# Patient Record
Sex: Female | Born: 1950
Health system: Southern US, Community
[De-identification: ages and names within clinical notes are randomized; demographics above are authoritative.]

## PROBLEM LIST (undated history)

## (undated) DIAGNOSIS — Z8719 Personal history of other diseases of the digestive system: Secondary | ICD-10-CM

## (undated) DIAGNOSIS — K219 Gastro-esophageal reflux disease without esophagitis: Secondary | ICD-10-CM

## (undated) DIAGNOSIS — Z9889 Other specified postprocedural states: Secondary | ICD-10-CM

## (undated) DIAGNOSIS — Z8489 Family history of other specified conditions: Secondary | ICD-10-CM

## (undated) DIAGNOSIS — M75101 Unspecified rotator cuff tear or rupture of right shoulder, not specified as traumatic: Secondary | ICD-10-CM

## (undated) DIAGNOSIS — I10 Essential (primary) hypertension: Secondary | ICD-10-CM

## (undated) DIAGNOSIS — R112 Nausea with vomiting, unspecified: Secondary | ICD-10-CM

## (undated) DIAGNOSIS — T7840XA Allergy, unspecified, initial encounter: Secondary | ICD-10-CM

## (undated) DIAGNOSIS — E119 Type 2 diabetes mellitus without complications: Secondary | ICD-10-CM

## (undated) DIAGNOSIS — E785 Hyperlipidemia, unspecified: Secondary | ICD-10-CM

## (undated) HISTORY — PX: ELBOW SURGERY: SHX618

## (undated) HISTORY — PX: KNEE SURGERY: SHX244

## (undated) HISTORY — DX: Type 2 diabetes mellitus without complications: E11.9

## (undated) HISTORY — DX: Allergy, unspecified, initial encounter: T78.40XA

## (undated) HISTORY — DX: Essential (primary) hypertension: I10

## (undated) HISTORY — DX: Gastro-esophageal reflux disease without esophagitis: K21.9

## (undated) HISTORY — PX: TUBAL LIGATION: SHX77

## (undated) HISTORY — DX: Hyperlipidemia, unspecified: E78.5

---
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
## 1998-08-15 ENCOUNTER — Other Ambulatory Visit: Admission: RE | Admit: 1998-08-15 | Discharge: 1998-08-15 | Payer: Self-pay | Admitting: Obstetrics and Gynecology

## 1999-09-10 ENCOUNTER — Other Ambulatory Visit: Admission: RE | Admit: 1999-09-10 | Discharge: 1999-09-10 | Payer: Self-pay | Admitting: Obstetrics and Gynecology

## 2000-09-22 ENCOUNTER — Other Ambulatory Visit: Admission: RE | Admit: 2000-09-22 | Discharge: 2000-09-22 | Payer: Self-pay | Admitting: Obstetrics and Gynecology

## 2001-10-13 ENCOUNTER — Other Ambulatory Visit: Admission: RE | Admit: 2001-10-13 | Discharge: 2001-10-13 | Payer: Self-pay | Admitting: Obstetrics and Gynecology

## 2002-10-19 ENCOUNTER — Other Ambulatory Visit: Admission: RE | Admit: 2002-10-19 | Discharge: 2002-10-19 | Payer: Self-pay | Admitting: Obstetrics and Gynecology

## 2002-10-19 ENCOUNTER — Encounter: Payer: Self-pay | Admitting: Obstetrics and Gynecology

## 2002-10-19 ENCOUNTER — Encounter: Admission: RE | Admit: 2002-10-19 | Discharge: 2002-10-19 | Payer: Self-pay | Admitting: Obstetrics and Gynecology

## 2003-11-22 ENCOUNTER — Other Ambulatory Visit: Admission: RE | Admit: 2003-11-22 | Discharge: 2003-11-22 | Payer: Self-pay | Admitting: Obstetrics and Gynecology

## 2004-12-09 ENCOUNTER — Other Ambulatory Visit: Admission: RE | Admit: 2004-12-09 | Discharge: 2004-12-09 | Payer: Self-pay | Admitting: Obstetrics and Gynecology

## 2006-01-15 ENCOUNTER — Ambulatory Visit: Payer: Self-pay | Admitting: Gastroenterology

## 2008-06-12 ENCOUNTER — Ambulatory Visit: Payer: Self-pay | Admitting: Family Medicine

## 2009-08-14 ENCOUNTER — Ambulatory Visit: Payer: Self-pay | Admitting: Family Medicine

## 2010-06-05 ENCOUNTER — Ambulatory Visit: Payer: Self-pay

## 2010-06-18 ENCOUNTER — Ambulatory Visit: Payer: Self-pay | Admitting: Orthopedic Surgery

## 2010-10-17 ENCOUNTER — Ambulatory Visit: Payer: Self-pay | Admitting: Family Medicine

## 2011-04-07 ENCOUNTER — Ambulatory Visit: Payer: Self-pay | Admitting: Unknown Physician Specialty

## 2011-05-05 ENCOUNTER — Ambulatory Visit: Payer: Self-pay | Admitting: Unknown Physician Specialty

## 2011-05-05 HISTORY — PX: COLONOSCOPY WITH ESOPHAGOGASTRODUODENOSCOPY (EGD): SHX5779

## 2011-05-07 LAB — PATHOLOGY REPORT

## 2011-11-12 ENCOUNTER — Ambulatory Visit: Payer: Self-pay | Admitting: Family Medicine

## 2012-12-02 ENCOUNTER — Ambulatory Visit: Payer: Self-pay | Admitting: Family Medicine

## 2013-11-13 HISTORY — PX: ANAL FISSURE REPAIR: SHX2312

## 2013-11-16 ENCOUNTER — Ambulatory Visit: Payer: Self-pay | Admitting: Surgery

## 2013-12-15 ENCOUNTER — Ambulatory Visit: Payer: Self-pay | Admitting: Family Medicine

## 2014-07-15 NOTE — Op Note (Signed)
PATIENT NAME:  Laurie Watson, CORTI MR#:  [id number] DATE OF BIRTH:  05/24/1950  DATE OF PROCEDURE:  11/16/2013  PREOPERATIVE DIAGNOSIS: Anal fissure.   POSTOPERATIVE DIAGNOSIS: Anal fissure.  OPERATION: Rectal exam under anesthesia with closed lateral sphincterotomy.   ANESTHESIA: General.   SURGEON: Rodena Goldmann, MD  OPERATIVE PROCEDURE: With the patient in the supine position, after the induction of appropriate general anesthesia, the patient was placed in lithotomy position, appropriately padded and positioned. Her rectum was digitally manipulated. It was very tight. There was some induration on the anterior surface in lithotomy. Careful inspection of the rectum revealed a fissure at the 12 o'clock position in lithotomy. It was debrided sharply. Lateral closed sphincterotomy was performed by inserting an 11 blade into the groove between the sphincters turning the blade 90 degrees and withdrawing it once. I broke up the remainder of the muscle fibers with index finger. I was unable to dilate the rectum to 2+ fingerbreadths without difficulty. The area was examined. No other abnormalities were identified. There was a small hemorrhoid in the anterior position. Exparel 20 mg was injected for postoperative pain control. Avitene and Gelfoam was inserted into the rectum and a sterile dressing applied. The patient was returned to the recovery room, having tolerated the procedure well. Sponge, instrument and needle counts were correct x 2 in the Operating Room.    ____________________________ Rodena Goldmann III, MD rle:TT D: 11/16/2013 14:02:34 ET T: 11/16/2013 23:08:09 ET JOB#: [id number]  cc: Micheline Maze, MD, <Dictator> Jerrell Belfast, MD Rodena Goldmann MD ELECTRONICALLY SIGNED 11/17/2013 17:28

## 2014-09-04 ENCOUNTER — Other Ambulatory Visit: Payer: Self-pay | Admitting: Family Medicine

## 2014-09-04 DIAGNOSIS — E7800 Pure hypercholesterolemia, unspecified: Secondary | ICD-10-CM

## 2014-09-04 DIAGNOSIS — K219 Gastro-esophageal reflux disease without esophagitis: Secondary | ICD-10-CM

## 2014-09-04 DIAGNOSIS — E119 Type 2 diabetes mellitus without complications: Secondary | ICD-10-CM

## 2014-09-04 MED ORDER — METFORMIN HCL 500 MG PO TABS: 500.0000 mg | ORAL_TABLET | Freq: Two times a day (BID) | ORAL | Status: DC

## 2014-09-04 MED ORDER — SIMVASTATIN 20 MG PO TABS: 20.0000 mg | ORAL_TABLET | Freq: Every day | ORAL | Status: DC

## 2014-09-04 MED ORDER — OMEPRAZOLE 20 MG PO CPDR: 20.0000 mg | DELAYED_RELEASE_CAPSULE | Freq: Every day | ORAL | Status: DC

## 2014-09-04 NOTE — Telephone Encounter (Signed)
Pt contacted office for refill request on the following medications: Metformin HCI 500 mg Omeprazole 20 mg Simvastatin 20 mg Pt stated that her insurance just notified her that she needs to have her RX filled at [facility] and she would like to use [employer]. Thanks TNP

## 2014-09-05 ENCOUNTER — Ambulatory Visit (INDEPENDENT_AMBULATORY_CARE_PROVIDER_SITE_OTHER): Payer: 59 | Admitting: Family Medicine

## 2014-09-05 ENCOUNTER — Encounter: Payer: Self-pay | Admitting: Family Medicine

## 2014-09-05 VITALS — BP 122/78 | HR 68 | Temp 98.0°F | Resp 16 | Wt 168.4 lb

## 2014-09-05 DIAGNOSIS — E7800 Pure hypercholesterolemia, unspecified: Secondary | ICD-10-CM | POA: Insufficient documentation

## 2014-09-05 DIAGNOSIS — R7989 Other specified abnormal findings of blood chemistry: Secondary | ICD-10-CM | POA: Insufficient documentation

## 2014-09-05 DIAGNOSIS — E1169 Type 2 diabetes mellitus with other specified complication: Secondary | ICD-10-CM | POA: Insufficient documentation

## 2014-09-05 DIAGNOSIS — K602 Anal fissure, unspecified: Secondary | ICD-10-CM | POA: Insufficient documentation

## 2014-09-05 DIAGNOSIS — H6692 Otitis media, unspecified, left ear: Secondary | ICD-10-CM

## 2014-09-05 DIAGNOSIS — H6592 Unspecified nonsuppurative otitis media, left ear: Secondary | ICD-10-CM

## 2014-09-05 DIAGNOSIS — K219 Gastro-esophageal reflux disease without esophagitis: Secondary | ICD-10-CM | POA: Insufficient documentation

## 2014-09-05 DIAGNOSIS — J0100 Acute maxillary sinusitis, unspecified: Secondary | ICD-10-CM

## 2014-09-05 DIAGNOSIS — E119 Type 2 diabetes mellitus without complications: Secondary | ICD-10-CM | POA: Insufficient documentation

## 2014-09-05 DIAGNOSIS — E785 Hyperlipidemia, unspecified: Secondary | ICD-10-CM | POA: Insufficient documentation

## 2014-09-05 DIAGNOSIS — K649 Unspecified hemorrhoids: Secondary | ICD-10-CM | POA: Insufficient documentation

## 2014-09-05 DIAGNOSIS — R945 Abnormal results of liver function studies: Secondary | ICD-10-CM | POA: Insufficient documentation

## 2014-09-05 MED ORDER — FLUTICASONE PROPIONATE 50 MCG/ACT NA SUSP: 2.0000 | Freq: Every day | NASAL | Status: DC

## 2014-09-05 MED ORDER — AZITHROMYCIN 250 MG PO TABS: ORAL_TABLET | ORAL | Status: DC

## 2014-09-05 NOTE — Progress Notes (Signed)
Subjective:    Patient ID: Laurie Watson, female    DOB: 07/28/1950, 64 y.o.   MRN: [id number]  HPI Sinusitis and Ear Pain onset 3 weeks ago. Started with sore throat and cough with clear rhinorrhea and post nasal drip.Went on a cruise to the [place] and had to use a Netti-Pot to release some of the pressure. Flights to and from the cruise embarkation area cause ears to stop up and become painful. Hearing slightly diminished with some popping occasionally. No fever. Headache/pressure around maxillary sinus area. No sputum production. Used OTC ear drops, Tylenol Allergy/Sinus and left over Hydrocodone cough syrup to control symptoms.  Patient Active Problem List   Diagnosis Date Noted  . Anal fissure 09/05/2014  . Diabetes mellitus, type 2 09/05/2014  . Abnormal LFTs 09/05/2014  . Acid reflux 09/05/2014  . Hemorrhoid 09/05/2014  . Hypercholesteremia 09/05/2014   Past Surgical History  Procedure Laterality Date  . Anal fissure repair  11/13/2013  . Eye surgery Left 1972  . Tonsillectomy and adenoidectomy  1970  . Tubal ligation    . Elbow surgery Left   . Knee surgery Left    History  Substance Use Topics  . Smoking status: Never Smoker   . Smokeless tobacco: Not on file  . Alcohol Use: No   Current Outpatient Prescriptions on File Prior to Visit  Medication Sig Dispense Refill  . metFORMIN (GLUCOPHAGE) 500 MG tablet Take 1 tablet (500 mg total) by mouth 2 (two) times daily with a meal. 60 tablet 6  . omeprazole (PRILOSEC) 20 MG capsule Take 1 capsule (20 mg total) by mouth daily. 30 capsule 6  . simvastatin (ZOCOR) 20 MG tablet Take 1 tablet (20 mg total) by mouth daily. 30 tablet 6   No current facility-administered medications on file prior to visit.   Allergies  Allergen Reactions  . Penicillins Rash   Family History  Problem Relation Age of Onset  . Diabetes Mother   . Breast cancer Mother   . Bladder Cancer Mother   . Hypertension Father   . Diabetes Father    . CVA Father   . Diabetes Brother   . Hyperlipidemia Brother   . Heart attack Brother   . Diabetes Brother   . Diabetes Brother        Review of Systems  Constitutional: Negative.   HENT: Positive for ear pain, hearing loss, postnasal drip, sinus pressure and sneezing.   Eyes: Positive for itching.  Respiratory: Positive for cough. Negative for shortness of breath and wheezing.   Cardiovascular: Negative.   Gastrointestinal: Negative.   Genitourinary: Negative.        Objective:   Physical Exam  Constitutional: She is oriented to person, place, and time. She appears well-developed and well-nourished. No distress.  HENT:  Head: Normocephalic and atraumatic.  Right Ear: Hearing and external ear normal.  Left Ear: Tympanic membrane is retracted. A middle ear effusion is present. Decreased hearing is noted.  Nose: Right sinus exhibits maxillary sinus tenderness. Right sinus exhibits no frontal sinus tenderness. Left sinus exhibits maxillary sinus tenderness. Left sinus exhibits no frontal sinus tenderness.  Eyes: Conjunctivae and lids are normal. Right eye exhibits no discharge. Left eye exhibits no discharge. No scleral icterus.  Pulmonary/Chest: Effort normal. No respiratory distress.  Musculoskeletal: Normal range of motion.  Neurological: She is alert and oriented to person, place, and time.  Skin: Skin is intact. No lesion and no rash noted.  Psychiatric: She has a normal  mood and affect. Her speech is normal and behavior is normal. Thought content normal.   BP 122/78 mmHg  Pulse 68  Temp(Src) 98 F (36.7 C) (Oral)  Resp 16  Wt 168 lb 6.4 oz (76.386 kg)     Assessment & Plan:  1. Left otitis media with effusion Onset after onset of sinus congestion and during flight back from cruise to the [place]. Will treat with steroid nasal spray and antibiotic. May use Mucinex and Claritin for symptomatic relief. Recheck prn. - fluticasone (FLONASE) 50 MCG/ACT nasal spray;  Place 2 sprays into both nostrils daily.  Dispense: 16 g; Refill: 2 - azithromycin (ZITHROMAX Z-PAK) 250 MG tablet; Two tablets by mouth the first day then one daily for 4 days  Dispense: 6 each; Refill: 0  2. Subacute maxillary sinusitis Onset with some cough , sore throat and post nasal drip 3 weeks ago. Worsened during cruise. Some relief from use of Netti Pot. May continue Tylenol or Advil prn headache. Treat with steroid nasal spray and antibiotic. Recheck prn. - fluticasone (FLONASE) 50 MCG/ACT nasal spray; Place 2 sprays into both nostrils daily.  Dispense: 16 g; Refill: 2 - azithromycin (ZITHROMAX Z-PAK) 250 MG tablet; Two tablets by mouth the first day then one daily for 4 days  Dispense: 6 each; Refill: 0

## 2014-09-13 ENCOUNTER — Telehealth: Payer: Self-pay | Admitting: Family Medicine

## 2014-09-13 DIAGNOSIS — H9202 Otalgia, left ear: Secondary | ICD-10-CM

## 2014-09-13 NOTE — Telephone Encounter (Signed)
Pt stated she saw Simona Huh on 09/05/14 and finished the Z pack on Saturday 09/09/14. Pt stated she has had little improvement and would like to go ahead with the referral to ENT. Thanks TNP

## 2014-09-13 NOTE — Telephone Encounter (Signed)
Please advise if okay to proceed with ENT referral.

## 2014-09-14 NOTE — Telephone Encounter (Signed)
Will send order for referral.

## 2014-11-15 ENCOUNTER — Other Ambulatory Visit: Payer: Self-pay | Admitting: Family Medicine

## 2014-11-15 DIAGNOSIS — Z1231 Encounter for screening mammogram for malignant neoplasm of breast: Secondary | ICD-10-CM

## 2014-12-12 ENCOUNTER — Telehealth: Payer: Self-pay | Admitting: Family Medicine

## 2014-12-12 ENCOUNTER — Ambulatory Visit (INDEPENDENT_AMBULATORY_CARE_PROVIDER_SITE_OTHER): Payer: 59 | Admitting: Family Medicine

## 2014-12-12 ENCOUNTER — Encounter: Payer: Self-pay | Admitting: Family Medicine

## 2014-12-12 VITALS — BP 130/84 | HR 72 | Temp 98.2°F | Resp 16 | Wt 167.0 lb

## 2014-12-12 DIAGNOSIS — N644 Mastodynia: Secondary | ICD-10-CM

## 2014-12-12 NOTE — Telephone Encounter (Signed)
Pt called saying she called [facility] to try to schedule her diagnostic mammogram and they told her that the order sent over was not complete and that she could not schedule it until it was.  They told her to call us and let us know that we needed to call them.  Her call back is [phone]   Please let her know when it is complete so she can call them back.  Thanks Con Memos

## 2014-12-12 NOTE — Telephone Encounter (Signed)
Per [facility] clock position for right breast pain needs to be noted in order unless pain is in entire breast.There also needs to be an order for left breast ultrasound entered in EPIC

## 2014-12-12 NOTE — Progress Notes (Signed)
Subjective:    Patient ID: Laurie Watson, female    DOB: 05/28/1950, 64 y.o.   MRN: [id number]  HPI  Breast Pain Pt c/o Right breast pain since the end of July. The onset has been gradual and has been in an intermittent pattern. The course is worsening. The pain is described as burning and throbbing, and radiates to the right shoulder. Pt describes the pain as mild, but is severe when exacerbated by coughing, sneezing, direct contact with the affected area, etc. Pt denies nipple discharge or redness. Pt reports she has noticed some swelling of the right axilla. When asked if pt noticed any mass, pt stated, "I feel some knots, but am unsure if that is due to scar tissue (pt has a h/o breast biopsy "many years ago"). There is a family history of breast cancer in the pt's mother. Pt states she believes the pain can be secondary to the biopsy she had several years ago or shingles (which pt has a H/O) vs cancer.  Review of Systems  Constitutional: Negative for fever, chills, diaphoresis, activity change, appetite change, fatigue and unexpected weight change.       Breast Pain is positive   BP 130/84 mmHg  Pulse 72  Temp(Src) 98.2 F (36.8 C) (Oral)  Resp 16  Wt 167 lb (75.751 kg)   Patient Active Problem List   Diagnosis Date Noted  . Anal fissure 09/05/2014  . Diabetes mellitus, type 2 09/05/2014  . Abnormal LFTs 09/05/2014  . Acid reflux 09/05/2014  . Hemorrhoid 09/05/2014  . Hypercholesteremia 09/05/2014   No past medical history on file. Current Outpatient Prescriptions on File Prior to Visit  Medication Sig  . aspirin 81 MG chewable tablet Chew 1 tablet by mouth daily.  . cholecalciferol (VITAMIN D) 1000 UNITS tablet Take 1 tablet by mouth daily.  Marland Kitchen docusate sodium (COLACE) 100 MG capsule Take 1 capsule by mouth daily.  . fluticasone (FLONASE) 50 MCG/ACT nasal spray Place 2 sprays into both nostrils daily.  Marland Kitchen glucose blood test strip   . metFORMIN (GLUCOPHAGE) 500 MG tablet  Take 1 tablet (500 mg total) by mouth 2 (two) times daily with a meal.  . Omega-3 Fatty Acids (FISH OIL BURP-LESS) 1000 MG CAPS Take 1 capsule by mouth daily.  Marland Kitchen omeprazole (PRILOSEC) 20 MG capsule Take 1 capsule (20 mg total) by mouth daily.  . Probiotic CAPS Take 1 capsule by mouth daily.  . simvastatin (ZOCOR) 20 MG tablet Take 1 tablet (20 mg total) by mouth daily.   No current facility-administered medications on file prior to visit.   Allergies  Allergen Reactions  . Penicillins Rash   Past Surgical History  Procedure Laterality Date  . Anal fissure repair  11/13/2013  . Eye surgery Left 1972  . Tonsillectomy and adenoidectomy  1970  . Tubal ligation    . Elbow surgery Left   . Knee surgery Left    Social History   Social History  . Marital Status: Married    Spouse Name: N/A  . Number of Children: N/A  . Years of Education: N/A   Occupational History  . Not on file.   Social History Main Topics  . Smoking status: Never Smoker   . Smokeless tobacco: Never Used  . Alcohol Use: 0.0 oz/week    0 Standard drinks or equivalent per week     Comment: occasional  . Drug Use: No  . Sexual Activity: Not on file   Other Topics Concern  .  Not on file   Social History Narrative   Family History  Problem Relation Age of Onset  . Diabetes Mother   . Breast cancer Mother   . Bladder Cancer Mother   . Hypertension Father   . Diabetes Father   . CVA Father   . Diabetes Brother   . Hyperlipidemia Brother   . Heart attack Brother   . Diabetes Brother   . Diabetes Brother      .result     Objective:   Physical Exam  Constitutional: She appears well-developed and well-nourished.  Cardiovascular: Normal rate and regular rhythm.   Pulmonary/Chest: Effort normal and breath sounds normal. Right breast exhibits tenderness (at 10:00). Right breast exhibits no inverted nipple, no mass and no nipple discharge. Left breast exhibits no inverted nipple, no mass, no nipple  discharge and no tenderness.    BP 130/84 mmHg  Pulse 72  Temp(Src) 98.2 F (36.8 C) (Oral)  Resp 16  Wt 167 lb (75.751 kg)     Assessment & Plan:  1. Breast pain, right Worsening. Patient really concerned about breast cancer. Will refer for diagnostic mammogram and ultrasound. Further plan pending these results.  - MM Digital Diagnostic Bilat; Future - US BREAST COMPLETE UNI RIGHT INC AXILLA; Future - US BREAST COMPLETE UNI LEFT INC AXILLA; Future  Margarita Rana, MD

## 2014-12-12 NOTE — Telephone Encounter (Signed)
Laurie Watson- Do you know how to fix this. Thanks.

## 2014-12-13 NOTE — Telephone Encounter (Signed)
Has been taken care of and patient notified. Thanks.

## 2014-12-13 NOTE — Telephone Encounter (Signed)
Tried documenting clock position in the left breast US order, Judson Roch will let me know if is not correct. Renaldo Fiddler, CMA

## 2014-12-20 ENCOUNTER — Other Ambulatory Visit: Payer: Self-pay | Admitting: Family Medicine

## 2014-12-20 ENCOUNTER — Ambulatory Visit
Admission: RE | Admit: 2014-12-20 | Discharge: 2014-12-20 | Disposition: A | Discharge status: Home / Self Care | Payer: 59 | Source: Ambulatory Visit | Attending: Family Medicine | Admitting: Family Medicine

## 2014-12-20 DIAGNOSIS — N644 Mastodynia: Secondary | ICD-10-CM

## 2015-02-06 ENCOUNTER — Ambulatory Visit: Payer: Self-pay

## 2015-03-05 ENCOUNTER — Encounter: Payer: Self-pay | Admitting: Family Medicine

## 2015-03-05 ENCOUNTER — Ambulatory Visit (INDEPENDENT_AMBULATORY_CARE_PROVIDER_SITE_OTHER): Payer: 59 | Admitting: Family Medicine

## 2015-03-05 ENCOUNTER — Other Ambulatory Visit: Payer: Self-pay | Admitting: Family Medicine

## 2015-03-05 VITALS — BP 120/72 | HR 81 | Temp 98.4°F | Resp 16 | Ht 63.0 in | Wt 167.0 lb

## 2015-03-05 DIAGNOSIS — E11319 Type 2 diabetes mellitus with unspecified diabetic retinopathy without macular edema: Secondary | ICD-10-CM | POA: Insufficient documentation

## 2015-03-05 DIAGNOSIS — Z23 Encounter for immunization: Secondary | ICD-10-CM

## 2015-03-05 DIAGNOSIS — R319 Hematuria, unspecified: Secondary | ICD-10-CM

## 2015-03-05 DIAGNOSIS — Z0000 Encounter for general adult medical examination without abnormal findings: Secondary | ICD-10-CM | POA: Diagnosis not present

## 2015-03-05 DIAGNOSIS — E119 Type 2 diabetes mellitus without complications: Secondary | ICD-10-CM | POA: Insufficient documentation

## 2015-03-05 DIAGNOSIS — E7800 Pure hypercholesterolemia, unspecified: Secondary | ICD-10-CM | POA: Diagnosis not present

## 2015-03-05 DIAGNOSIS — E1169 Type 2 diabetes mellitus with other specified complication: Secondary | ICD-10-CM | POA: Insufficient documentation

## 2015-03-05 LAB — POCT URINALYSIS DIPSTICK
Bilirubin, UA: NEGATIVE
Glucose, UA: NEGATIVE
Ketones, UA: NEGATIVE
Leukocytes, UA: NEGATIVE
Nitrite, UA: NEGATIVE
Protein, UA: NEGATIVE
Spec Grav, UA: >=1.03
Urobilinogen, UA: 0.2
pH, UA: 5

## 2015-03-05 NOTE — Progress Notes (Signed)
Patient ID: Laurie Watson, female   DOB: [birth year], 64 y.o.   MRN: [id number]        Patient: Laurie Watson, Female    DOB: 02-10-1951, 64 y.o.   MRN: [id number] Visit Date: 03/05/2015  Today's Provider: Margarita Rana, MD   Chief Complaint  Patient presents with  . Annual Exam   Subjective:    Annual physical exam Laurie Watson is a 64 y.o. female who presents today for health maintenance and complete physical. She feels well. She reports exercising staying active with daily activities. She reports she is sleeping well.  12/23/13 CPE 12/02/12 Pap-neg; HPV-neg 12/20/14 Mammo- BI-RADS 1 05/05/11 Colon-Diverticulosis, int hemorrhoids 10/17/10 EKG Results for orders placed or performed in visit on 03/05/15  POCT urinalysis dipstick  Result Value Ref Range   Color, UA yellow    Clarity, UA clear    Glucose, UA neg    Bilirubin, UA neg    Ketones, UA neg    Spec Grav, UA >=1.030    Blood, UA small    pH, UA 5.0    Protein, UA neg    Urobilinogen, UA 0.2    Nitrite, UA neg    Leukocytes, UA Negative Negative    -----------------------------------------------------------------   Review of Systems  Constitutional: Negative.   HENT: Negative.   Eyes: Negative.   Respiratory: Negative.   Cardiovascular: Negative.   Gastrointestinal: Negative.   Endocrine: Negative.   Genitourinary: Negative.   Musculoskeletal: Negative.   Skin: Negative.   Allergic/Immunologic: Negative.   Neurological: Negative.   Hematological: Negative.   Psychiatric/Behavioral: Negative.     Social History      She  reports that she has never smoked. She has never used smokeless tobacco. She reports that she drinks alcohol. She reports that she does not use illicit drugs.       Social History   Social History  . Marital Status: Married    Spouse Name: N/A  . Number of Children: N/A  . Years of Education: N/A   Social History Main Topics  . Smoking status: Never Smoker   .  Smokeless tobacco: Never Used  . Alcohol Use: 0.0 oz/week    0 Standard drinks or equivalent per week     Comment: occasional  . Drug Use: No  . Sexual Activity: Not Asked   Other Topics Concern  . None   Social History Narrative    Patient Active Problem List   Diagnosis Date Noted  . Diabetes mellitus without complication ([place]) [id number]  . Anal fissure 09/05/2014  . Diabetes mellitus, type 2 ([place]) 09/05/2014  . Abnormal LFTs 09/05/2014  . Acid reflux 09/05/2014  . Hemorrhoid 09/05/2014  . Hypercholesteremia 09/05/2014    Past Surgical History  Procedure Laterality Date  . Anal fissure repair  11/13/2013  . Eye surgery Left 1972  . Tonsillectomy and adenoidectomy  1970  . Tubal ligation    . Elbow surgery Left   . Knee surgery Left   . Breast biopsy Right 1990's    Core - neg    Family History        Family Status  Relation Status Death Age  . Mother Alive   . Father Alive   . Brother Alive   . Brother Alive   . Brother Alive         Her family history includes Bladder Cancer in her mother; Breast cancer in her mother; CVA in her father; Diabetes in her brother,  brother, brother, father, and mother; Heart attack in her brother; Hyperlipidemia in her brother; Hypertension in her father.    Allergies  Allergen Reactions  . Penicillins Rash    Previous Medications   ASPIRIN 81 MG CHEWABLE TABLET    Chew 1 tablet by mouth daily.   CHOLECALCIFEROL (VITAMIN D) 1000 UNITS TABLET    Take 1 tablet by mouth daily.   COENZYME Q10 (Q-10 CO-ENZYME PO)    Take by mouth.   DOCUSATE SODIUM (COLACE) 100 MG CAPSULE    Take 1 capsule by mouth daily.   GLUCOSE BLOOD TEST STRIP       METFORMIN (GLUCOPHAGE) 500 MG TABLET    Take 1 tablet (500 mg total) by mouth 2 (two) times daily with a meal.   OMEGA-3 FATTY ACIDS (FISH OIL BURP-LESS) 1000 MG CAPS    Take 1 capsule by mouth daily.   OMEPRAZOLE (PRILOSEC) 20 MG CAPSULE    Take 1 capsule (20 mg total) by mouth daily.    PROBIOTIC CAPS    Take 1 capsule by mouth daily.   SIMVASTATIN (ZOCOR) 20 MG TABLET    Take 1 tablet (20 mg total) by mouth daily.    Patient Care Team: Margarita Rana, MD as PCP - General (Family Medicine)     Objective:   Vitals: BP 120/72 mmHg  Pulse 81  Temp(Src) 98.4 F (36.9 C) (Oral)  Resp 16  Ht 5\' 3"  (1.6 m)  Wt 167 lb (75.751 kg)  BMI 29.59 kg/m2  SpO2 97%   Physical Exam  Constitutional: She is oriented to person, place, and time. She appears well-developed and well-nourished.  HENT:  Head: Normocephalic and atraumatic.  Right Ear: Tympanic membrane, external ear and ear canal normal.  Left Ear: Tympanic membrane, external ear and ear canal normal.  Nose: Nose normal.  Mouth/Throat: Uvula is midline, oropharynx is clear and moist and mucous membranes are normal.  Eyes: Conjunctivae, EOM and lids are normal. Pupils are equal, round, and reactive to light.  Neck: Trachea normal and normal range of motion. Neck supple. Carotid bruit is not present. No thyroid mass and no thyromegaly present.  Cardiovascular: Normal rate, regular rhythm and normal heart sounds.   Pulmonary/Chest: Effort normal and breath sounds normal.  Abdominal: Soft. Normal appearance and bowel sounds are normal. There is no hepatosplenomegaly. There is no tenderness.  Musculoskeletal: Normal range of motion.  Lymphadenopathy:    She has no cervical adenopathy.    She has no axillary adenopathy.  Neurological: She is alert and oriented to person, place, and time. She has normal strength. No cranial nerve deficit.  Skin: Skin is warm, dry and intact.  Psychiatric: She has a normal mood and affect. Her speech is normal and behavior is normal. Judgment and thought content normal. Cognition and memory are normal.     Depression Screen PHQ 2/9 Scores 03/05/2015  PHQ - 2 Score 0      Assessment & Plan:     Routine Health Maintenance and Physical Exam  Exercise Activities and Dietary  recommendations Goals    . Exercise 150 minutes per week (moderate activity)       Immunization History  Administered Date(s) Administered  . Influenza,inj,Quad PF,36+ Mos 03/05/2015  . Pneumococcal Polysaccharide-23 02/11/2012  . Td 04/23/1999  . Tdap 10/17/2010  . Zoster 04/05/2010      1. Annual physical exam Stable. Patient advised to continue eating healthy and exercise daily. - POCT urinalysis dipstick - CA 125  2.  Need for influenza vaccination - Flu Vaccine QUAD 36+ mos IM  3. Diabetes mellitus without complication ([place]) - Hemoglobin A1c  4. Hypercholesteremia - CBC with Differential/Platelet - Comprehensive metabolic panel - Lipid Panel With LDL/HDL Ratio - TSH     Patient seen and examined by Dr. Jerrell Belfast, and note scribed by Philbert Riser. Dim[place], CMA.  I have reviewed the document for accuracy and completeness and I agree with above. Jerrell Belfast, MD   Margarita Rana, MD   --------------------------------------------------------------------

## 2015-03-06 LAB — URINALYSIS, MICROSCOPIC ONLY: Casts: NONE SEEN /lpf

## 2015-03-07 ENCOUNTER — Telehealth: Payer: Self-pay

## 2015-03-07 LAB — COMPREHENSIVE METABOLIC PANEL
ALT: 33 IU/L — ABNORMAL HIGH (ref 0–32)
AST: 31 IU/L (ref 0–40)
Albumin/Globulin Ratio: 2.7 — ABNORMAL HIGH (ref 1.1–2.5)
Albumin: 4.8 g/dL (ref 3.6–4.8)
Alkaline Phosphatase: 84 IU/L (ref 39–117)
BUN/Creatinine Ratio: 22 (ref 11–26)
BUN: 15 mg/dL (ref 8–27)
Bilirubin Total: 0.6 mg/dL (ref 0.0–1.2)
CO2: 25 mmol/L (ref 18–29)
Calcium: 9.6 mg/dL (ref 8.7–10.3)
Chloride: 100 mmol/L (ref 96–106)
Creatinine, Ser: 0.69 mg/dL (ref 0.57–1.00)
GFR calc Af Amer: 108 mL/min/{1.73_m2} (ref >59)
GFR calc non Af Amer: 94 mL/min/{1.73_m2} (ref >59)
Globulin, Total: 1.8 g/dL (ref 1.5–4.5)
Glucose: 131 mg/dL — ABNORMAL HIGH (ref 65–99)
Potassium: 4.3 mmol/L (ref 3.5–5.2)
Sodium: 140 mmol/L (ref 134–144)
Total Protein: 6.6 g/dL (ref 6.0–8.5)

## 2015-03-07 LAB — CBC WITH DIFFERENTIAL/PLATELET
Basophils Absolute: 0 10*3/uL (ref 0.0–0.2)
Basos: 1 %
EOS (ABSOLUTE): 0.2 10*3/uL (ref 0.0–0.4)
Eos: 4 %
Hematocrit: 40.6 % (ref 34.0–46.6)
Hemoglobin: 13.6 g/dL (ref 11.1–15.9)
Immature Grans (Abs): 0 10*3/uL (ref 0.0–0.1)
Immature Granulocytes: 0 %
Lymphocytes Absolute: 2.3 10*3/uL (ref 0.7–3.1)
Lymphs: 34 %
MCH: 30.6 pg (ref 26.6–33.0)
MCHC: 33.5 g/dL (ref 31.5–35.7)
MCV: 91 fL (ref 79–97)
Monocytes Absolute: 0.3 10*3/uL (ref 0.1–0.9)
Monocytes: 5 %
Neutrophils Absolute: 3.8 10*3/uL (ref 1.4–7.0)
Neutrophils: 56 %
Platelets: 317 10*3/uL (ref 150–379)
RBC: 4.45 x10E6/uL (ref 3.77–5.28)
RDW: 13 % (ref 12.3–15.4)
WBC: 6.7 10*3/uL (ref 3.4–10.8)

## 2015-03-07 LAB — CA 125: CA 125: 8.6 U/mL (ref 0.0–38.1)

## 2015-03-07 LAB — LIPID PANEL WITH LDL/HDL RATIO
Cholesterol, Total: 171 mg/dL (ref 100–199)
HDL: 53 mg/dL (ref >39)
LDL Calculated: 93 mg/dL (ref 0–99)
LDl/HDL Ratio: 1.8 ratio units (ref 0.0–3.2)
Triglycerides: 125 mg/dL (ref 0–149)
VLDL Cholesterol Cal: 25 mg/dL (ref 5–40)

## 2015-03-07 LAB — HEMOGLOBIN A1C
Est. average glucose Bld gHb Est-mCnc: 143 mg/dL
Hgb A1c MFr Bld: 6.6 % — ABNORMAL HIGH (ref 4.8–5.6)

## 2015-03-07 LAB — TSH: TSH: 1.69 u[IU]/mL (ref 0.450–4.500)

## 2015-03-07 NOTE — Telephone Encounter (Signed)
Patient advised as directed below. Patient verbalized understanding.  

## 2015-03-07 NOTE — Telephone Encounter (Signed)
-----   Message from Margarita Rana, MD sent at 03/07/2015  9:26 AM EST ----- Labs stable except HgbA1c slightly higher than previous. Would recommend eating healthy and exercise and ov to recheck in 3 months. Thanks.

## 2015-03-13 ENCOUNTER — Encounter: Payer: Self-pay | Admitting: Family Medicine

## 2015-03-23 ENCOUNTER — Other Ambulatory Visit: Payer: Self-pay | Admitting: Family Medicine

## 2015-03-23 DIAGNOSIS — E7800 Pure hypercholesterolemia, unspecified: Secondary | ICD-10-CM

## 2015-03-23 NOTE — Telephone Encounter (Signed)
Last CPE 03/05/2015. Lipids were WNL. Renaldo Fiddler, CMA

## 2015-06-25 ENCOUNTER — Other Ambulatory Visit: Payer: Self-pay | Admitting: Family Medicine

## 2015-06-25 DIAGNOSIS — K219 Gastro-esophageal reflux disease without esophagitis: Secondary | ICD-10-CM

## 2015-10-17 ENCOUNTER — Other Ambulatory Visit: Payer: Self-pay | Admitting: Family Medicine

## 2015-10-17 DIAGNOSIS — E7800 Pure hypercholesterolemia, unspecified: Secondary | ICD-10-CM

## 2015-11-27 ENCOUNTER — Other Ambulatory Visit: Payer: Self-pay | Admitting: Family Medicine

## 2015-11-27 DIAGNOSIS — Z1231 Encounter for screening mammogram for malignant neoplasm of breast: Secondary | ICD-10-CM

## 2015-12-20 ENCOUNTER — Other Ambulatory Visit: Payer: Self-pay | Admitting: Family Medicine

## 2015-12-20 DIAGNOSIS — K219 Gastro-esophageal reflux disease without esophagitis: Secondary | ICD-10-CM

## 2016-02-05 ENCOUNTER — Ambulatory Visit
Admission: RE | Admit: 2016-02-05 | Discharge: 2016-02-05 | Disposition: A | Discharge status: Home / Self Care | Payer: BLUE CROSS/BLUE SHIELD | Source: Ambulatory Visit | Attending: Family Medicine | Admitting: Family Medicine

## 2016-02-05 DIAGNOSIS — Z1231 Encounter for screening mammogram for malignant neoplasm of breast: Secondary | ICD-10-CM

## 2016-02-06 ENCOUNTER — Telehealth: Payer: Self-pay

## 2016-02-06 NOTE — Telephone Encounter (Signed)
Patient has been advised. KW 

## 2016-02-06 NOTE — Telephone Encounter (Signed)
-----   Message from Mar Daring, PA-C sent at 02/06/2016  9:22 AM EST ----- Normal mammogram. Repeat screening in one year.

## 2016-03-05 ENCOUNTER — Ambulatory Visit (INDEPENDENT_AMBULATORY_CARE_PROVIDER_SITE_OTHER): Payer: BLUE CROSS/BLUE SHIELD | Admitting: Physician Assistant

## 2016-03-05 ENCOUNTER — Encounter: Payer: Self-pay | Admitting: Physician Assistant

## 2016-03-05 VITALS — BP 140/88 | HR 78 | Temp 98.4°F | Resp 16 | Wt 164.8 lb

## 2016-03-05 DIAGNOSIS — R059 Cough, unspecified: Secondary | ICD-10-CM

## 2016-03-05 DIAGNOSIS — Z1231 Encounter for screening mammogram for malignant neoplasm of breast: Secondary | ICD-10-CM | POA: Diagnosis not present

## 2016-03-05 DIAGNOSIS — K219 Gastro-esophageal reflux disease without esophagitis: Secondary | ICD-10-CM | POA: Diagnosis not present

## 2016-03-05 DIAGNOSIS — R05 Cough: Secondary | ICD-10-CM

## 2016-03-05 DIAGNOSIS — E119 Type 2 diabetes mellitus without complications: Secondary | ICD-10-CM | POA: Diagnosis not present

## 2016-03-05 DIAGNOSIS — Z1211 Encounter for screening for malignant neoplasm of colon: Secondary | ICD-10-CM | POA: Diagnosis not present

## 2016-03-05 DIAGNOSIS — Z1239 Encounter for other screening for malignant neoplasm of breast: Secondary | ICD-10-CM

## 2016-03-05 DIAGNOSIS — Z0000 Encounter for general adult medical examination without abnormal findings: Secondary | ICD-10-CM

## 2016-03-05 DIAGNOSIS — Z23 Encounter for immunization: Secondary | ICD-10-CM

## 2016-03-05 DIAGNOSIS — Z124 Encounter for screening for malignant neoplasm of cervix: Secondary | ICD-10-CM

## 2016-03-05 DIAGNOSIS — Z1159 Encounter for screening for other viral diseases: Secondary | ICD-10-CM

## 2016-03-05 DIAGNOSIS — E7800 Pure hypercholesterolemia, unspecified: Secondary | ICD-10-CM

## 2016-03-05 DIAGNOSIS — K602 Anal fissure, unspecified: Secondary | ICD-10-CM | POA: Diagnosis not present

## 2016-03-05 LAB — POCT UA - MICROALBUMIN: Microalbumin Ur, POC: 100 mg/L

## 2016-03-05 LAB — IFOBT (OCCULT BLOOD): IFOBT: NEGATIVE

## 2016-03-05 MED ORDER — METFORMIN HCL 500 MG PO TABS: 500.0000 mg | ORAL_TABLET | Freq: Two times a day (BID) | ORAL | 3 refills | Status: DC

## 2016-03-05 MED ORDER — ONETOUCH DELICA LANCETS 33G MISC: 5 refills | Status: DC

## 2016-03-05 MED ORDER — SIMVASTATIN 20 MG PO TABS: ORAL_TABLET | ORAL | 3 refills | Status: DC

## 2016-03-05 MED ORDER — OMEPRAZOLE 20 MG PO CPDR: DELAYED_RELEASE_CAPSULE | ORAL | 3 refills | Status: DC

## 2016-03-05 MED ORDER — GLUCOSE BLOOD VI STRP: ORAL_STRIP | 5 refills | Status: DC

## 2016-03-05 MED ORDER — LIDOCAINE HCL 3 % EX CREA: 1.0000 "application " | TOPICAL_CREAM | CUTANEOUS | 0 refills | Status: DC | PRN

## 2016-03-05 MED ORDER — PREDNISONE 10 MG (21) PO TBPK: ORAL_TABLET | ORAL | 0 refills | Status: DC

## 2016-03-05 NOTE — Patient Instructions (Signed)

## 2016-03-05 NOTE — Progress Notes (Signed)
Patient: Laurie Watson, Female    DOB: 01-Mar-1951, 65 y.o.   MRN: [id number] Visit Date: 03/05/2016  Today's Provider: Mar Daring, PA-C   Chief Complaint  Patient presents with  . Annual Exam   Subjective:    Annual physical exam Laurie Watson is a 65 y.o. female who presents today for health maintenance and complete physical. She feels well. She reports exercising. She reports she is sleeping well. She has been in a lot of stressed her husband was diagnosed with prostate cancer.  Last CPE: 03/05/15 Colon:05/05/2011- Diverticulosis-Int. Hemorrhoids Mammogram; 02/05/16 BI-RADS 1 -----------------------------------------------------------------   Review of Systems  Constitutional: Negative.   HENT: Positive for congestion.   Eyes: Negative.   Respiratory: Positive for cough.   Cardiovascular: Negative.   Gastrointestinal: Negative.   Endocrine: Negative.   Genitourinary: Negative.   Musculoskeletal: Negative.   Skin: Negative.   Allergic/Immunologic: Positive for environmental allergies.  Neurological: Negative.   Hematological: Negative.   Psychiatric/Behavioral: Negative.     Social History      She  reports that she has never smoked. She has never used smokeless tobacco. She reports that she drinks alcohol. She reports that she does not use drugs.       Social History   Social History  . Marital status: Married    Spouse name: N/A  . Number of children: N/A  . Years of education: N/A   Social History Main Topics  . Smoking status: Never Smoker  . Smokeless tobacco: Never Used  . Alcohol use 0.0 oz/week     Comment: occasional  . Drug use: No  . Sexual activity: Not Asked   Other Topics Concern  . None   Social History Narrative  . None    Past Medical History:  Diagnosis Date  . Diabetes mellitus without complication ([place])   . GERD (gastroesophageal reflux disease)   . Hyperlipidemia      Patient Active Problem List   Diagnosis Date Noted  . Diabetes mellitus without complication ([place]) [id number]  . Anal fissure 09/05/2014  . Diabetes mellitus, type 2 ([place]) 09/05/2014  . Abnormal LFTs 09/05/2014  . Acid reflux 09/05/2014  . Hemorrhoid 09/05/2014  . Hypercholesteremia 09/05/2014    Past Surgical History:  Procedure Laterality Date  . ANAL FISSURE REPAIR  11/13/2013  . BREAST BIOPSY Right 1990's   Core - neg  . ELBOW SURGERY Left   . EYE SURGERY Left 1972  . KNEE SURGERY Left   . TONSILLECTOMY AND ADENOIDECTOMY  1970  . TUBAL LIGATION      Family History        Family Status  Relation Status  . Mother Alive  . Father Alive  . Brother Alive  . Brother Alive  . Brother Alive        Her family history includes Bladder Cancer in her mother; Breast cancer in her mother; CVA in her father; Diabetes in her brother, brother, brother, father, and mother; Heart attack in her brother; Hyperlipidemia in her brother; Hypertension in her father.     Allergies  Allergen Reactions  . Codeine Nausea Only  . Penicillins Rash     Current Outpatient Prescriptions:  .  aspirin 81 MG chewable tablet, Chew 1 tablet by mouth daily., Disp: , Rfl:  .  cholecalciferol (VITAMIN D) 1000 UNITS tablet, Take 1 tablet by mouth daily., Disp: , Rfl:  .  Coenzyme Q10 (Q-10 CO-ENZYME PO), Take by mouth., Disp: ,  Rfl:  .  docusate sodium (COLACE) 100 MG capsule, Take 1 capsule by mouth daily., Disp: , Rfl:  .  glucose blood test strip, , Disp: , Rfl:  .  metFORMIN (GLUCOPHAGE) 500 MG tablet, Take 1 tablet (500 mg total) by mouth 2 (two) times daily with a meal., Disp: 60 tablet, Rfl: 6 .  Omega-3 Fatty Acids (FISH OIL BURP-LESS) 1000 MG CAPS, Take 1 capsule by mouth daily., Disp: , Rfl:  .  omeprazole (PRILOSEC) 20 MG capsule, TAKE 1 CAPSULE(20 MG) BY MOUTH DAILY, Disp: 30 capsule, Rfl: 5 .  Probiotic CAPS, Take 1 capsule by mouth daily., Disp: , Rfl:  .  simvastatin (ZOCOR) 20 MG tablet, TAKE 1 TABLET(20 MG) BY MOUTH  DAILY, Disp: 90 tablet, Rfl: 1 .  ketoconazole (NIZORAL) 2 % shampoo, , Disp: , Rfl: 10   Patient Care Team: Mar Daring, PA-C as PCP - General (Family Medicine)      Objective:   Vitals: BP 140/88 (BP Location: Right Arm, Patient Position: Sitting, Cuff Size: Normal) Comment: Pt checked this am and it was 122/84  Pulse 78   Temp 98.4 F (36.9 C) (Oral)   Resp 16   Wt 164 lb 12.8 oz (74.8 kg)   BMI 29.19 kg/m    Physical Exam  Constitutional: She is oriented to person, place, and time. She appears well-developed and well-nourished. No distress.  HENT:  Head: Normocephalic and atraumatic.  Right Ear: Hearing, tympanic membrane, external ear and ear canal normal.  Left Ear: Hearing, tympanic membrane, external ear and ear canal normal.  Nose: Nose normal.  Mouth/Throat: Uvula is midline, oropharynx is clear and moist and mucous membranes are normal. No oropharyngeal exudate.  Eyes: Conjunctivae and EOM are normal. Pupils are equal, round, and reactive to light. Right eye exhibits no discharge. Left eye exhibits no discharge. No scleral icterus.  Neck: Normal range of motion. Neck supple. No JVD present. Carotid bruit is not present. No tracheal deviation present. No thyromegaly present.  Cardiovascular: Normal rate, regular rhythm, normal heart sounds and intact distal pulses.  Exam reveals no gallop and no friction rub.   No murmur heard. Pulmonary/Chest: Effort normal and breath sounds normal. No respiratory distress. She has no wheezes. She has no rales. She exhibits no tenderness. Right breast exhibits no inverted nipple, no mass, no nipple discharge, no skin change and no tenderness. Left breast exhibits no inverted nipple, no mass, no nipple discharge, no skin change and no tenderness. Breasts are symmetrical.  Abdominal: Soft. Bowel sounds are normal. She exhibits no distension and no mass. There is no tenderness. There is no rebound and no guarding. Hernia confirmed  negative in the right inguinal area and confirmed negative in the left inguinal area.  Genitourinary: Rectum normal, vagina normal and uterus normal. Rectal exam shows guaiac negative stool. No breast swelling, tenderness, discharge or bleeding. Pelvic exam was performed with patient supine. There is no rash, tenderness, lesion or injury on the right labia. There is no rash, tenderness, lesion or injury on the left labia. Cervix exhibits no motion tenderness, no discharge and no friability. Right adnexum displays no mass, no tenderness and no fullness. Left adnexum displays no mass, no tenderness and no fullness. No erythema, tenderness or bleeding in the vagina. No signs of injury around the vagina. No vaginal discharge found.  Musculoskeletal: Normal range of motion. She exhibits no edema or tenderness.  Lymphadenopathy:    She has no cervical adenopathy.  Right: No inguinal adenopathy present.       Left: No inguinal adenopathy present.  Neurological: She is alert and oriented to person, place, and time. She has normal reflexes. No cranial nerve deficit. Coordination normal.  Skin: Skin is warm and dry. No rash noted. She is not diaphoretic.  Psychiatric: She has a normal mood and affect. Her behavior is normal. Judgment and thought content normal.  Vitals reviewed.    Depression Screen PHQ 2/9 Scores 03/05/2015  PHQ - 2 Score 0      Assessment & Plan:     Routine Health Maintenance and Physical Exam  Exercise Activities and Dietary recommendations Goals    . Exercise 150 minutes per week (moderate activity)       Immunization History  Administered Date(s) Administered  . Influenza,inj,Quad PF,36+ Mos 03/05/2015  . Pneumococcal Polysaccharide-23 02/11/2012  . Td 04/23/1999  . Tdap 10/17/2010  . Zoster 04/05/2010    Health Maintenance  Topic Date Due  . Hepatitis C Screening  10-03-1950  . FOOT EXAM  10/25/1962  . URINE MICROALBUMIN  10/25/1962  . HIV Screening   10/25/1967  . HEMOGLOBIN A1C  09/04/2015  . INFLUENZA VACCINE  10/23/2015  . PAP SMEAR  12/03/2015  . OPHTHALMOLOGY EXAM  02/09/2016  . PNEUMOCOCCAL POLYSACCHARIDE VACCINE (2) 02/10/2017  . MAMMOGRAM  02/04/2018  . TETANUS/TDAP  10/16/2020  . COLONOSCOPY  05/04/2021  . ZOSTAVAX  Completed     Discussed health benefits of physical activity, and encouraged her to engage in regular exercise appropriate for her age and condition.    1. Annual physical exam Normal physical exam today. Will check labs as below and f/u pending lab results. If labs are stable and WNL she will not need to have these rechecked for one year at her next annual physical exam. She is to call the office in the meantime if she has any acute issue, questions or concerns. - CBC with Differential/Platelet  2. Breast cancer screening Breast exam today was normal. There is no family history of breast cancer. She does perform regular self breast exams. Mammogram was Done on 02/05/2016 and was normal.  3. Screening for cervical cancer Pap collected today. Will send as below and f/u pending results. - Pap IG and HPV (high risk) DNA detection  4. Colon cancer screening OC light was negative today in the office. - IFOBT POC (occult bld, rslt in office)  5. Need for hepatitis C screening test - Hepatitis C antibody  6. Need for influenza vaccination Flu vaccine given today without complication. Patient sat upright for 15 minutes to check for adverse reaction before being released. - Flu Vaccine QUAD 36+ mos IM  7. Hypercholesteremia Stable. Diagnosis pulled for medication refill. Continue current medical treatment plan. Will check labs as below and f/u pending results. - CBC with Differential/Platelet - Comprehensive metabolic panel - Lipid panel - TSH - simvastatin (ZOCOR) 20 MG tablet; TAKE 1 TABLET(20 MG) BY MOUTH DAILY  Dispense: 90 tablet; Refill: 3  8. Diabetes mellitus without complication ([facility]) Stable.  Diagnosis pulled for medication refill. Continue current medical treatment plan. Will check labs as below and f/u pending results. Urine microalbumin was slightly elevated today at 100. Discussed possibly adding an ACE inhibitor or ARB for protection of kidney but patient declines at this time. - POCT UA - Microalbumin - Hemoglobin [id number] - ONETOUCH DELICA LANCETS [id number] MISC; To check blood sugar once daily  Dispense: 100 each; Refill: 5 - glucose blood (ONE TOUCH  ULTRA TEST) test strip; To check blood sugar once daily  Dispense: 100 each; Refill: 5 - metFORMIN (GLUCOPHAGE) 500 MG tablet; Take 1 tablet (500 mg total) by mouth 2 (two) times daily with a meal.  Dispense: 180 tablet; Refill: 3  9. Anal fissure History of this that was repaired by Dr. Pat Patrick. Patient uses lidocaine 3% cream as below as needed when she has some constipation to help with pain secondary to the anal fissure. This was refilled as below. - lidocaine (LINDAMANTLE) 3 % CREA cream; Apply 1 application topically as needed.  Dispense: 85 g; Refill: 0  10. Gastroesophageal reflux disease without esophagitis Stable. Diagnosis pulled for medication refill. Continue current medical treatment plan. - omeprazole (PRILOSEC) 20 MG capsule; TAKE 1 CAPSULE(20 MG) BY MOUTH DAILY  Dispense: 90 capsule; Refill: 3  11. Cough Cough that has been present over the last 2 weeks. I will give her prednisone taper as below. Patient is to call if symptoms fail to improve or worsen. - predniSONE (STERAPRED UNI-PAK 21 TAB) 10 MG (21) TBPK tablet; Take as directed on package instructions  Dispense: 21 tablet; Refill: 0  --------------------------------------------------------------------    Mar Daring, PA-C  [facility]

## 2016-03-06 ENCOUNTER — Telehealth: Payer: Self-pay

## 2016-03-06 LAB — LIPID PANEL
Chol/HDL Ratio: 3.3 ratio units (ref 0.0–4.4)
Cholesterol, Total: 163 mg/dL (ref 100–199)
HDL: 49 mg/dL (ref >39)
LDL Calculated: 84 mg/dL (ref 0–99)
Triglycerides: 151 mg/dL — ABNORMAL HIGH (ref 0–149)
VLDL Cholesterol Cal: 30 mg/dL (ref 5–40)

## 2016-03-06 LAB — COMPREHENSIVE METABOLIC PANEL
ALT: 35 IU/L — ABNORMAL HIGH (ref 0–32)
AST: 36 IU/L (ref 0–40)
Albumin/Globulin Ratio: 1.9 (ref 1.2–2.2)
Albumin: 4.6 g/dL (ref 3.6–4.8)
Alkaline Phosphatase: 86 IU/L (ref 39–117)
BUN/Creatinine Ratio: 17 (ref 12–28)
BUN: 12 mg/dL (ref 8–27)
Bilirubin Total: 0.7 mg/dL (ref 0.0–1.2)
CO2: 26 mmol/L (ref 18–29)
Calcium: 9.7 mg/dL (ref 8.7–10.3)
Chloride: 100 mmol/L (ref 96–106)
Creatinine, Ser: 0.7 mg/dL (ref 0.57–1.00)
GFR calc Af Amer: 107 mL/min/{1.73_m2} (ref >59)
GFR calc non Af Amer: 93 mL/min/{1.73_m2} (ref >59)
Globulin, Total: 2.4 g/dL (ref 1.5–4.5)
Glucose: 113 mg/dL — ABNORMAL HIGH (ref 65–99)
Potassium: 4.3 mmol/L (ref 3.5–5.2)
Sodium: 141 mmol/L (ref 134–144)
Total Protein: 7 g/dL (ref 6.0–8.5)

## 2016-03-06 LAB — CBC WITH DIFFERENTIAL/PLATELET
Basophils Absolute: 0 10*3/uL (ref 0.0–0.2)
Basos: 1 %
EOS (ABSOLUTE): 0.2 10*3/uL (ref 0.0–0.4)
Eos: 2 %
Hematocrit: 39.2 % (ref 34.0–46.6)
Hemoglobin: 13.1 g/dL (ref 11.1–15.9)
Immature Grans (Abs): 0 10*3/uL (ref 0.0–0.1)
Immature Granulocytes: 0 %
Lymphocytes Absolute: 2.3 10*3/uL (ref 0.7–3.1)
Lymphs: 35 %
MCH: 30 pg (ref 26.6–33.0)
MCHC: 33.4 g/dL (ref 31.5–35.7)
MCV: 90 fL (ref 79–97)
Monocytes Absolute: 0.3 10*3/uL (ref 0.1–0.9)
Monocytes: 4 %
Neutrophils Absolute: 3.8 10*3/uL (ref 1.4–7.0)
Neutrophils: 58 %
Platelets: 310 10*3/uL (ref 150–379)
RBC: 4.36 x10E6/uL (ref 3.77–5.28)
RDW: 13.3 % (ref 12.3–15.4)
WBC: 6.5 10*3/uL (ref 3.4–10.8)

## 2016-03-06 LAB — HEMOGLOBIN A1C
Est. average glucose Bld gHb Est-mCnc: 134 mg/dL
Hgb A1c MFr Bld: 6.3 % — ABNORMAL HIGH (ref 4.8–5.6)

## 2016-03-06 LAB — HEPATITIS C ANTIBODY: Hep C Virus Ab: <0.1 s/co ratio (ref 0.0–0.9)

## 2016-03-06 LAB — TSH: TSH: 0.813 u[IU]/mL (ref 0.450–4.500)

## 2016-03-06 NOTE — Telephone Encounter (Signed)
Patient has been advised. KW 

## 2016-03-06 NOTE — Telephone Encounter (Signed)
-----   Message from Mar Daring, [place] sent at 03/06/2016  8:24 AM EST ----- All labs are within normal limits and stable.  HgBA1c has improved from 6.6 last year to 6.3. Thanks! -JB

## 2016-03-13 ENCOUNTER — Telehealth: Payer: Self-pay

## 2016-03-13 LAB — PAP IG AND HPV HIGH-RISK
HPV, high-risk: NEGATIVE
PAP Smear Comment: 0

## 2016-03-13 NOTE — Telephone Encounter (Signed)
-----   Message from Mar Daring, PA-C sent at 03/13/2016  8:19 AM EST ----- Pap is normal, HPV negative.  Will repeat in 3-5 years if patient desires. Not recommended for screening after 65.

## 2016-03-13 NOTE — Telephone Encounter (Signed)
Advised pt of lab results. Pt verbally acknowledges understanding. Laurie Watson, CMA   

## 2017-01-19 ENCOUNTER — Other Ambulatory Visit: Payer: Self-pay | Admitting: Physician Assistant

## 2017-01-19 DIAGNOSIS — E7800 Pure hypercholesterolemia, unspecified: Secondary | ICD-10-CM

## 2017-03-06 ENCOUNTER — Encounter: Payer: BLUE CROSS/BLUE SHIELD | Admitting: Physician Assistant

## 2017-04-20 ENCOUNTER — Other Ambulatory Visit: Payer: Self-pay | Admitting: Physician Assistant

## 2017-04-20 DIAGNOSIS — K219 Gastro-esophageal reflux disease without esophagitis: Secondary | ICD-10-CM

## 2017-07-19 ENCOUNTER — Other Ambulatory Visit: Payer: Self-pay | Admitting: Physician Assistant

## 2017-07-19 DIAGNOSIS — E7800 Pure hypercholesterolemia, unspecified: Secondary | ICD-10-CM

## 2017-07-27 ENCOUNTER — Other Ambulatory Visit: Payer: Self-pay | Admitting: Physician Assistant

## 2017-11-10 ENCOUNTER — Encounter: Payer: BLUE CROSS/BLUE SHIELD | Admitting: Physician Assistant

## 2017-11-10 DIAGNOSIS — Z800 Family history of malignant neoplasm of digestive organs: Secondary | ICD-10-CM | POA: Diagnosis not present

## 2017-11-10 DIAGNOSIS — E119 Type 2 diabetes mellitus without complications: Secondary | ICD-10-CM | POA: Diagnosis not present

## 2017-11-11 ENCOUNTER — Ambulatory Visit (INDEPENDENT_AMBULATORY_CARE_PROVIDER_SITE_OTHER): Payer: Medicare Other | Admitting: Physician Assistant

## 2017-11-11 ENCOUNTER — Encounter: Payer: Self-pay | Admitting: Physician Assistant

## 2017-11-11 ENCOUNTER — Other Ambulatory Visit (HOSPITAL_COMMUNITY)
Admission: RE | Admit: 2017-11-11 | Discharge: 2017-11-11 | Disposition: A | Discharge status: Home / Self Care | Payer: Medicare Other | Source: Ambulatory Visit | Attending: Physician Assistant | Admitting: Physician Assistant

## 2017-11-11 VITALS — BP 116/80 | HR 71 | Temp 97.9°F | Resp 16 | Ht 63.0 in | Wt 160.0 lb

## 2017-11-11 DIAGNOSIS — Z6828 Body mass index (BMI) 28.0-28.9, adult: Secondary | ICD-10-CM | POA: Diagnosis not present

## 2017-11-11 DIAGNOSIS — Z1231 Encounter for screening mammogram for malignant neoplasm of breast: Secondary | ICD-10-CM | POA: Diagnosis not present

## 2017-11-11 DIAGNOSIS — Z780 Asymptomatic menopausal state: Secondary | ICD-10-CM

## 2017-11-11 DIAGNOSIS — Z0000 Encounter for general adult medical examination without abnormal findings: Secondary | ICD-10-CM | POA: Diagnosis not present

## 2017-11-11 DIAGNOSIS — K219 Gastro-esophageal reflux disease without esophagitis: Secondary | ICD-10-CM | POA: Diagnosis not present

## 2017-11-11 DIAGNOSIS — Z124 Encounter for screening for malignant neoplasm of cervix: Secondary | ICD-10-CM | POA: Insufficient documentation

## 2017-11-11 DIAGNOSIS — D225 Melanocytic nevi of trunk: Secondary | ICD-10-CM | POA: Diagnosis not present

## 2017-11-11 DIAGNOSIS — L821 Other seborrheic keratosis: Secondary | ICD-10-CM | POA: Diagnosis not present

## 2017-11-11 DIAGNOSIS — D2272 Melanocytic nevi of left lower limb, including hip: Secondary | ICD-10-CM | POA: Diagnosis not present

## 2017-11-11 DIAGNOSIS — Z1211 Encounter for screening for malignant neoplasm of colon: Secondary | ICD-10-CM | POA: Diagnosis not present

## 2017-11-11 DIAGNOSIS — E119 Type 2 diabetes mellitus without complications: Secondary | ICD-10-CM | POA: Diagnosis not present

## 2017-11-11 DIAGNOSIS — Z114 Encounter for screening for human immunodeficiency virus [HIV]: Secondary | ICD-10-CM

## 2017-11-11 DIAGNOSIS — D2261 Melanocytic nevi of right upper limb, including shoulder: Secondary | ICD-10-CM | POA: Diagnosis not present

## 2017-11-11 DIAGNOSIS — R7989 Other specified abnormal findings of blood chemistry: Secondary | ICD-10-CM

## 2017-11-11 DIAGNOSIS — Z13820 Encounter for screening for osteoporosis: Secondary | ICD-10-CM | POA: Diagnosis not present

## 2017-11-11 DIAGNOSIS — Z85828 Personal history of other malignant neoplasm of skin: Secondary | ICD-10-CM | POA: Diagnosis not present

## 2017-11-11 DIAGNOSIS — R945 Abnormal results of liver function studies: Secondary | ICD-10-CM | POA: Diagnosis not present

## 2017-11-11 DIAGNOSIS — Z1239 Encounter for other screening for malignant neoplasm of breast: Secondary | ICD-10-CM

## 2017-11-11 DIAGNOSIS — D2262 Melanocytic nevi of left upper limb, including shoulder: Secondary | ICD-10-CM | POA: Diagnosis not present

## 2017-11-11 DIAGNOSIS — E7800 Pure hypercholesterolemia, unspecified: Secondary | ICD-10-CM | POA: Diagnosis not present

## 2017-11-11 DIAGNOSIS — Z23 Encounter for immunization: Secondary | ICD-10-CM

## 2017-11-11 DIAGNOSIS — D2271 Melanocytic nevi of right lower limb, including hip: Secondary | ICD-10-CM | POA: Diagnosis not present

## 2017-11-11 DIAGNOSIS — Z08 Encounter for follow-up examination after completed treatment for malignant neoplasm: Secondary | ICD-10-CM | POA: Diagnosis not present

## 2017-11-11 LAB — POCT UA - MICROALBUMIN: Microalbumin Ur, POC: 50 mg/L

## 2017-11-11 MED ORDER — SIMVASTATIN 20 MG PO TABS: ORAL_TABLET | ORAL | 1 refills | Status: DC

## 2017-11-11 MED ORDER — METFORMIN HCL 500 MG PO TABS: 500.0000 mg | ORAL_TABLET | Freq: Two times a day (BID) | ORAL | 1 refills | Status: DC

## 2017-11-11 MED ORDER — GLUCOSE BLOOD VI STRP: ORAL_STRIP | 5 refills | Status: DC

## 2017-11-11 MED ORDER — OMEPRAZOLE 20 MG PO CPDR: DELAYED_RELEASE_CAPSULE | ORAL | 1 refills | Status: DC

## 2017-11-11 NOTE — Progress Notes (Signed)
Patient: Laurie Watson, Female    DOB: 1950-04-05, 67 y.o.   MRN: [id number] Visit Date: 11/11/2017  Today's Provider: Mar Daring, PA-C   Chief Complaint  Patient presents with  . Annual Exam   Subjective:    Annual wellness visit Laurie Watson is a 67 y.o. female. She feels well. She reports exercising some, busy during day and walking. She reports she is sleeping well.  Pap: 03/05/16-Normal, HPV negative Mammogram: 02/15/16- BiRads:1 Colonoscopy: 05/05/11; scheduled for Jan 18, 2018 repeat -----------------------------------------------------------   Review of Systems  Constitutional: Negative.   HENT: Negative.   Eyes: Negative.   Respiratory: Negative.   Cardiovascular: Negative.   Gastrointestinal: Negative.   Endocrine: Negative.   Genitourinary: Positive for enuresis (stress incontinence) and frequency (nighttime).  Musculoskeletal: Negative.   Skin: Negative.   Allergic/Immunologic: Negative.   Neurological: Negative.   Hematological: Negative.   Psychiatric/Behavioral: Negative.     Social History   Socioeconomic History  . Marital status: Married    Spouse name: Not on file  . Number of children: Not on file  . Years of education: Not on file  . Highest education level: Not on file  Occupational History  . Not on file  Social Needs  . Financial resource strain: Not on file  . Food insecurity:    Worry: Not on file    Inability: Not on file  . Transportation needs:    Medical: Not on file    Non-medical: Not on file  Tobacco Use  . Smoking status: Never Smoker  . Smokeless tobacco: Never Used  Substance and Sexual Activity  . Alcohol use: Yes    Alcohol/week: 0.0 standard drinks    Comment: occasional  . Drug use: No  . Sexual activity: Not on file  Lifestyle  . Physical activity:    Days per week: Not on file    Minutes per session: Not on file  . Stress: Not on file  Relationships  . Social connections:    Talks  on phone: Not on file    Gets together: Not on file    Attends religious service: Not on file    Active member of club or organization: Not on file    Attends meetings of clubs or organizations: Not on file    Relationship status: Not on file  . Intimate partner violence:    Fear of current or ex partner: Not on file    Emotionally abused: Not on file    Physically abused: Not on file    Forced sexual activity: Not on file  Other Topics Concern  . Not on file  Social History Narrative  . Not on file    Past Medical History:  Diagnosis Date  . Diabetes mellitus without complication ([place])   . GERD (gastroesophageal reflux disease)   . Hyperlipidemia      Patient Active Problem List   Diagnosis Date Noted  . Diabetes mellitus without complication ([place]) 93/81/0175  . Anal fissure 09/05/2014  . Abnormal LFTs 09/05/2014  . Acid reflux 09/05/2014  . Hemorrhoid 09/05/2014  . Hypercholesteremia 09/05/2014    Past Surgical History:  Procedure Laterality Date  . ANAL FISSURE REPAIR  11/13/2013  . BREAST BIOPSY Right 1990's   Core - neg  . ELBOW SURGERY Left   . EYE SURGERY Left 1972  . KNEE SURGERY Left   . TONSILLECTOMY AND ADENOIDECTOMY  1970  . TUBAL LIGATION  Her family history includes Bladder Cancer in her mother; Breast cancer in her mother; CVA in her father; Diabetes in her brother, brother, brother, father, and mother; Heart attack in her brother; Hyperlipidemia in her brother; Hypertension in her father.      Current Outpatient Medications:  .  aspirin 81 MG chewable tablet, Chew 1 tablet by mouth daily., Disp: , Rfl:  .  cholecalciferol (VITAMIN D) 1000 UNITS tablet, Take 1 tablet by mouth daily., Disp: , Rfl:  .  Coenzyme Q10 (Q-10 CO-ENZYME PO), Take by mouth., Disp: , Rfl:  .  docusate sodium (COLACE) 100 MG capsule, Take 1 capsule by mouth daily., Disp: , Rfl:  .  glucose blood (ONE TOUCH ULTRA TEST) test strip, To check blood sugar once daily, Disp: 100  each, Rfl: 5 .  lidocaine (LINDAMANTLE) 3 % CREA cream, Apply 1 application topically as needed., Disp: 85 g, Rfl: 0 .  metFORMIN (GLUCOPHAGE) 500 MG tablet, Take 1 tablet (500 mg total) by mouth 2 (two) times daily with a meal., Disp: 180 tablet, Rfl: 1 .  Omega-3 Fatty Acids (FISH OIL BURP-LESS) 1000 MG CAPS, Take 1 capsule by mouth daily., Disp: , Rfl:  .  omeprazole (PRILOSEC) 20 MG capsule, TAKE ONE CAPSULE BY MOUTH DAILY, Disp: 90 capsule, Rfl: 1 .  ONETOUCH DELICA LANCETS 99B MISC, To check blood sugar once daily, Disp: 100 each, Rfl: 5 .  Probiotic CAPS, Take 1 capsule by mouth daily., Disp: , Rfl:  .  simvastatin (ZOCOR) 20 MG tablet, TAKE 1 TABLET(20 MG) BY MOUTH DAILY, Disp: 90 tablet, Rfl: 1  Patient Care Team: Mar Daring, PA-C as PCP - General (Family Medicine)     Objective:   Vitals: BP 116/80 (BP Location: Left Arm, Patient Position: Sitting, Cuff Size: Normal)   Pulse 71   Temp 97.9 F (36.6 C) (Oral)   Resp 16   Ht 5\' 3"  (1.6 m)   Wt 160 lb (72.6 kg)   SpO2 99%   BMI 28.34 kg/m   Physical Exam  Constitutional: She is oriented to person, place, and time. She appears well-developed and well-nourished. No distress.  HENT:  Head: Normocephalic and atraumatic.  Right Ear: Hearing, tympanic membrane, external ear and ear canal normal.  Left Ear: Hearing, tympanic membrane, external ear and ear canal normal.  Nose: Nose normal.  Mouth/Throat: Uvula is midline, oropharynx is clear and moist and mucous membranes are normal. No oropharyngeal exudate.  Eyes: Pupils are equal, round, and reactive to light. Conjunctivae and EOM are normal. Right eye exhibits no discharge. Left eye exhibits no discharge. No scleral icterus.  Neck: Normal range of motion. Neck supple. No JVD present. Carotid bruit is not present. No tracheal deviation present. No thyromegaly present.  Cardiovascular: Normal rate, regular rhythm, normal heart sounds and intact distal pulses. Exam  reveals no gallop and no friction rub.  No murmur heard. Pulses:      Dorsalis pedis pulses are 2+ on the right side, and 2+ on the left side.       Posterior tibial pulses are 2+ on the right side, and 2+ on the left side.  Pulmonary/Chest: Effort normal and breath sounds normal. No respiratory distress. She has no wheezes. She has no rales. She exhibits no tenderness. Right breast exhibits no inverted nipple, no mass, no nipple discharge, no skin change and no tenderness. Left breast exhibits no inverted nipple, no mass, no nipple discharge, no skin change and no tenderness. No breast tenderness,  discharge or bleeding. Breasts are symmetrical.  Abdominal: Soft. Bowel sounds are normal. She exhibits no distension and no mass. There is no tenderness. There is no rebound and no guarding. Hernia confirmed negative in the right inguinal area and confirmed negative in the left inguinal area.  Genitourinary: Rectum normal, vagina normal and uterus normal. No breast tenderness, discharge or bleeding. Pelvic exam was performed with patient supine. There is no rash, tenderness, lesion or injury on the right labia. There is no rash, tenderness, lesion or injury on the left labia. Cervix exhibits no motion tenderness, no discharge and no friability. Right adnexum displays no mass, no tenderness and no fullness. Left adnexum displays no mass, no tenderness and no fullness. No erythema, tenderness or bleeding in the vagina. No signs of injury around the vagina. No vaginal discharge found.  Musculoskeletal: Normal range of motion. She exhibits no edema or tenderness.       Right foot: There is normal range of motion and no deformity.       Left foot: There is normal range of motion and no deformity.  Feet:  Right Foot:  Protective Sensation: 10 sites tested. 10 sites sensed.  Skin Integrity: Negative for ulcer or skin breakdown.  Left Foot:  Protective Sensation: 10 sites tested. 10 sites sensed.  Skin  Integrity: Negative for ulcer or skin breakdown.  Lymphadenopathy:    She has no cervical adenopathy.       Right: No inguinal adenopathy present.       Left: No inguinal adenopathy present.  Neurological: She is alert and oriented to person, place, and time. She has normal reflexes. No cranial nerve deficit. Coordination normal.  Skin: Skin is warm and dry. No rash noted. She is not diaphoretic.  Psychiatric: She has a normal mood and affect. Her behavior is normal. Judgment and thought content normal.  Vitals reviewed.   Activities of Daily Living In your present state of health, do you have any difficulty performing the following activities: 11/11/2017  Hearing? N  Vision? N  Difficulty concentrating or making decisions? N  Walking or climbing stairs? N  Dressing or bathing? N  Doing errands, shopping? N  Some recent data might be hidden    Fall Risk Assessment Fall Risk  11/11/2017 03/05/2015  Falls in the past year? No No     Depression Screen PHQ 2/9 Scores 11/11/2017 03/05/2015  PHQ - 2 Score 0 0  PHQ- 9 Score 0 -    Cognitive Testing - 6-CIT  Correct? Score   What year is it? yes 0 0 or 4  What month is it? yes 0 0 or 3  Memorize:    Pia Mau,  44,  [address],  [place],      What time is it? (within 1 hour) yes 0 0 or 3  Count backwards from 20 yes 0 0, 2, or 4  Name the months of the year yes 0 0, 2, or 4  Repeat name & address above no 4 0, 2, 4, 6, 8, or 10       TOTAL SCORE  4/28   Interpretation:  Normal  Normal (0-7) Abnormal (8-28)     Assessment & Plan:     Annual Wellness Visit  Reviewed patient's Family Medical History Reviewed and updated list of patient's medical providers Assessment of cognitive impairment was done Assessed patient's functional ability Established a written schedule for health screening [place] Completed and Reviewed  Exercise Activities  and Dietary recommendations Goals    . Exercise 150  minutes per week (moderate activity)       Immunization History  Administered Date(s) Administered  . Influenza,inj,Quad PF,6+ Mos 03/05/2015, 03/05/2016  . Pneumococcal Polysaccharide-23 02/11/2012, 11/11/2017  . Td 04/23/1999  . Tdap 10/17/2010  . Zoster 04/05/2010    Health Maintenance  Topic Date Due  . FOOT EXAM  10/25/1962  . HIV Screening  10/25/1967  . HEMOGLOBIN A1C  09/03/2016  . OPHTHALMOLOGY EXAM  02/25/2017  . URINE MICROALBUMIN  03/05/2017  . DEXA SCAN  10/24/2017  . INFLUENZA VACCINE  10/22/2017  . PNA vac Low Risk Adult (1 of 2 - PCV13) 10/24/2017  . MAMMOGRAM  02/04/2018  . PAP SMEAR  03/06/2019  . TETANUS/TDAP  10/16/2020  . COLONOSCOPY  05/04/2021  . Hepatitis C Screening  Completed     Discussed health benefits of physical activity, and encouraged her to engage in regular exercise appropriate for her age and condition.    1. Initial Medicare annual wellness visit Normal exam today. Vaccinations updated. EKG today showed NSR rate of 61, no ST changes.  - CBC with Differential/Platelet - Comprehensive metabolic panel - Hemoglobin A1c - Lipid Panel With LDL/HDL Ratio - TSH - EKG 12-Lead  2. Breast cancer screening Breast exam today was normal. There is no family history of breast cancer. She does perform regular self breast exams. Mammogram was ordered as below. Information for [facility] clinic was given to patient so she may schedule her mammogram at her convenience. - MM DIGITAL SCREENING BILATERAL; Future  3. Cervical cancer screening Pap collected today. Will send as below and f/u pending results. - Cytology - PAP  4. Colon cancer screening Scheduled for 01/18/18 per patient.   5. Osteoporosis screening - DG Bone Density; Future  6. Postmenopausal estrogen deficiency - DG Bone Density; Future  7. Hypercholesteremia Stable. Diagnosis pulled for medication refill. Continue current medical treatment plan. Will check labs as below and  f/u pending results. - CBC with Differential/Platelet - Comprehensive metabolic panel - Hemoglobin A1c - Lipid Panel With LDL/HDL Ratio - TSH - simvastatin (ZOCOR) 20 MG tablet; TAKE 1 TABLET(20 MG) BY MOUTH DAILY  Dispense: 90 tablet; Refill: 1  8. Diabetes mellitus without complication (HCC) Microalbumin normal. Stable. Diagnosis pulled for medication refill. Continue current medical treatment plan. Will check labs as below and f/u pending results. - POCT UA - Microalbumin - CBC with Differential/Platelet - Comprehensive metabolic panel - Hemoglobin A1c - Lipid Panel With LDL/HDL Ratio - TSH - glucose blood (ONE TOUCH ULTRA TEST) test strip; To check blood sugar once daily  Dispense: 100 each; Refill: 5 - metFORMIN (GLUCOPHAGE) 500 MG tablet; Take 1 tablet (500 mg total) by mouth 2 (two) times daily with a meal.  Dispense: 180 tablet; Refill: 1  9. Abnormal LFTs Diet controlled. Will check labs as below and f/u pending results. - CBC with Differential/Platelet - Comprehensive metabolic panel - Lipid Panel With LDL/HDL Ratio  10. Gastroesophageal reflux disease without esophagitis Stable. Diagnosis pulled for medication refill. Continue current medical treatment plan. Will check labs as below and f/u pending results. - CBC with Differential/Platelet - Comprehensive metabolic panel - omeprazole (PRILOSEC) 20 MG capsule; TAKE ONE CAPSULE BY MOUTH DAILY  Dispense: 90 capsule; Refill: 1  11. BMI 28.0-28.9,adult Counseled patient on healthy lifestyle modifications including dieting and exercise.  - Comprehensive metabolic panel - Hemoglobin A1c - Lipid Panel With LDL/HDL Ratio - TSH  12. Need for pneumococcal  vaccination Pneumococcal 23 Vaccine given to patient without complications. Patient sat for 15 minutes after administration and was tolerated well without adverse effects. - Pneumococcal polysaccharide vaccine 23-valent greater than or equal to 2yo subcutaneous/IM  13.  Screening for HIV without presence of risk factors - HIV antibody (with reflex)  ------------------------------------------------------------------------------------------------------------    Mar Daring, PA-C  [facility]

## 2017-11-12 ENCOUNTER — Telehealth: Payer: Self-pay

## 2017-11-12 LAB — HIV ANTIBODY (ROUTINE TESTING W REFLEX): HIV Screen 4th Generation wRfx: NONREACTIVE

## 2017-11-12 LAB — COMPREHENSIVE METABOLIC PANEL
ALT: 21 IU/L (ref 0–32)
AST: 22 IU/L (ref 0–40)
Albumin/Globulin Ratio: 2.1 (ref 1.2–2.2)
Albumin: 4.8 g/dL (ref 3.6–4.8)
Alkaline Phosphatase: 79 IU/L (ref 39–117)
BUN/Creatinine Ratio: 22 (ref 12–28)
BUN: 15 mg/dL (ref 8–27)
Bilirubin Total: 0.8 mg/dL (ref 0.0–1.2)
CO2: 24 mmol/L (ref 20–29)
Calcium: 9.6 mg/dL (ref 8.7–10.3)
Chloride: 99 mmol/L (ref 96–106)
Creatinine, Ser: 0.67 mg/dL (ref 0.57–1.00)
GFR calc Af Amer: 107 mL/min/{1.73_m2} (ref >59)
GFR calc non Af Amer: 93 mL/min/{1.73_m2} (ref >59)
Globulin, Total: 2.3 g/dL (ref 1.5–4.5)
Glucose: 104 mg/dL — ABNORMAL HIGH (ref 65–99)
Potassium: 4.3 mmol/L (ref 3.5–5.2)
Sodium: 140 mmol/L (ref 134–144)
Total Protein: 7.1 g/dL (ref 6.0–8.5)

## 2017-11-12 LAB — CBC WITH DIFFERENTIAL/PLATELET
Basophils Absolute: 0 10*3/uL (ref 0.0–0.2)
Basos: 0 %
EOS (ABSOLUTE): 0.1 10*3/uL (ref 0.0–0.4)
Eos: 2 %
Hematocrit: 39.7 % (ref 34.0–46.6)
Hemoglobin: 13.3 g/dL (ref 11.1–15.9)
Immature Grans (Abs): 0 10*3/uL (ref 0.0–0.1)
Immature Granulocytes: 0 %
Lymphocytes Absolute: 2.1 10*3/uL (ref 0.7–3.1)
Lymphs: 36 %
MCH: 29.8 pg (ref 26.6–33.0)
MCHC: 33.5 g/dL (ref 31.5–35.7)
MCV: 89 fL (ref 79–97)
Monocytes Absolute: 0.4 10*3/uL (ref 0.1–0.9)
Monocytes: 6 %
Neutrophils Absolute: 3.1 10*3/uL (ref 1.4–7.0)
Neutrophils: 56 %
Platelets: 318 10*3/uL (ref 150–450)
RBC: 4.46 x10E6/uL (ref 3.77–5.28)
RDW: 13.6 % (ref 12.3–15.4)
WBC: 5.7 10*3/uL (ref 3.4–10.8)

## 2017-11-12 LAB — LIPID PANEL WITH LDL/HDL RATIO
Cholesterol, Total: 186 mg/dL (ref 100–199)
HDL: 62 mg/dL (ref >39)
LDL Calculated: 99 mg/dL (ref 0–99)
LDl/HDL Ratio: 1.6 ratio (ref 0.0–3.2)
Triglycerides: 125 mg/dL (ref 0–149)
VLDL Cholesterol Cal: 25 mg/dL (ref 5–40)

## 2017-11-12 LAB — HEMOGLOBIN A1C
Est. average glucose Bld gHb Est-mCnc: 137 mg/dL
Hgb A1c MFr Bld: 6.4 % — ABNORMAL HIGH (ref 4.8–5.6)

## 2017-11-12 LAB — TSH: TSH: 1.49 u[IU]/mL (ref 0.450–4.500)

## 2017-11-12 NOTE — Telephone Encounter (Signed)
-----   Message from Mar Daring, [place] sent at 11/12/2017  9:10 AM EDT ----- Blood count is normal. Kidney and liver function are normal. A1c is up slightly to 6.4, was 6.3. Cholesterol normal. Thyroid normal. HIV screen negative.

## 2017-11-12 NOTE — Telephone Encounter (Signed)
Patient advised as directed below.  Thanks,  -Rad Gramling 

## 2017-11-13 ENCOUNTER — Telehealth: Payer: Self-pay

## 2017-11-13 LAB — CYTOLOGY - PAP
Diagnosis: NEGATIVE
HPV: NOT DETECTED

## 2017-11-13 NOTE — Telephone Encounter (Signed)
Patient advised as directed below.  Thanks,  -Semisi Biela 

## 2017-11-13 NOTE — Telephone Encounter (Signed)
-----   Message from Mar Daring, [place] sent at 11/13/2017  2:34 PM EDT ----- Pap is normal, HPV negative.  Will repeat in 3-5 years.

## 2017-12-07 ENCOUNTER — Ambulatory Visit
Admission: RE | Admit: 2017-12-07 | Discharge: 2017-12-07 | Disposition: A | Discharge status: Home / Self Care | Payer: Medicare Other | Source: Ambulatory Visit | Attending: Physician Assistant | Admitting: Physician Assistant

## 2017-12-07 DIAGNOSIS — Z780 Asymptomatic menopausal state: Secondary | ICD-10-CM | POA: Diagnosis not present

## 2017-12-07 DIAGNOSIS — Z1231 Encounter for screening mammogram for malignant neoplasm of breast: Secondary | ICD-10-CM | POA: Insufficient documentation

## 2017-12-07 DIAGNOSIS — Z13820 Encounter for screening for osteoporosis: Secondary | ICD-10-CM | POA: Diagnosis not present

## 2017-12-07 DIAGNOSIS — Z1239 Encounter for other screening for malignant neoplasm of breast: Secondary | ICD-10-CM

## 2018-01-18 ENCOUNTER — Encounter
Admission: RE | Disposition: A | Discharge status: Home / Self Care | Payer: Self-pay | Source: Ambulatory Visit | Attending: Unknown Physician Specialty

## 2018-01-18 ENCOUNTER — Ambulatory Visit: Payer: Medicare Other | Admitting: Anesthesiology

## 2018-01-18 ENCOUNTER — Ambulatory Visit
Admission: RE | Admit: 2018-01-18 | Discharge: 2018-01-18 | Disposition: A | Discharge status: Home / Self Care | Payer: Medicare Other | Source: Ambulatory Visit | Attending: Unknown Physician Specialty | Admitting: Unknown Physician Specialty

## 2018-01-18 ENCOUNTER — Encounter: Payer: Self-pay | Admitting: Anesthesiology

## 2018-01-18 DIAGNOSIS — E119 Type 2 diabetes mellitus without complications: Secondary | ICD-10-CM | POA: Diagnosis not present

## 2018-01-18 DIAGNOSIS — E785 Hyperlipidemia, unspecified: Secondary | ICD-10-CM | POA: Insufficient documentation

## 2018-01-18 DIAGNOSIS — K5790 Diverticulosis of intestine, part unspecified, without perforation or abscess without bleeding: Secondary | ICD-10-CM | POA: Diagnosis not present

## 2018-01-18 DIAGNOSIS — K219 Gastro-esophageal reflux disease without esophagitis: Secondary | ICD-10-CM | POA: Insufficient documentation

## 2018-01-18 DIAGNOSIS — Z8249 Family history of ischemic heart disease and other diseases of the circulatory system: Secondary | ICD-10-CM | POA: Insufficient documentation

## 2018-01-18 DIAGNOSIS — Z800 Family history of malignant neoplasm of digestive organs: Secondary | ICD-10-CM | POA: Insufficient documentation

## 2018-01-18 DIAGNOSIS — Z880 Allergy status to penicillin: Secondary | ICD-10-CM | POA: Insufficient documentation

## 2018-01-18 DIAGNOSIS — K640 First degree hemorrhoids: Secondary | ICD-10-CM | POA: Insufficient documentation

## 2018-01-18 DIAGNOSIS — Z885 Allergy status to narcotic agent status: Secondary | ICD-10-CM | POA: Diagnosis not present

## 2018-01-18 DIAGNOSIS — K648 Other hemorrhoids: Secondary | ICD-10-CM | POA: Diagnosis not present

## 2018-01-18 DIAGNOSIS — Z1211 Encounter for screening for malignant neoplasm of colon: Secondary | ICD-10-CM | POA: Diagnosis not present

## 2018-01-18 DIAGNOSIS — K5730 Diverticulosis of large intestine without perforation or abscess without bleeding: Secondary | ICD-10-CM | POA: Insufficient documentation

## 2018-01-18 DIAGNOSIS — Z79899 Other long term (current) drug therapy: Secondary | ICD-10-CM | POA: Diagnosis not present

## 2018-01-18 DIAGNOSIS — D123 Benign neoplasm of transverse colon: Secondary | ICD-10-CM | POA: Diagnosis not present

## 2018-01-18 DIAGNOSIS — Z7984 Long term (current) use of oral hypoglycemic drugs: Secondary | ICD-10-CM | POA: Insufficient documentation

## 2018-01-18 DIAGNOSIS — E7800 Pure hypercholesterolemia, unspecified: Secondary | ICD-10-CM | POA: Diagnosis not present

## 2018-01-18 DIAGNOSIS — Z7982 Long term (current) use of aspirin: Secondary | ICD-10-CM | POA: Diagnosis not present

## 2018-01-18 DIAGNOSIS — K635 Polyp of colon: Secondary | ICD-10-CM | POA: Diagnosis not present

## 2018-01-18 HISTORY — PX: COLONOSCOPY WITH PROPOFOL: SHX5780

## 2018-01-18 LAB — GLUCOSE, CAPILLARY: Glucose-Capillary: 138 mg/dL — ABNORMAL HIGH (ref 70–99)

## 2018-01-18 SURGERY — COLONOSCOPY WITH PROPOFOL
Anesthesia: General

## 2018-01-18 MED ORDER — SODIUM CHLORIDE 0.9 % IV SOLN
INTRAVENOUS | Status: DC
  Administered 2018-01-18: 1000 mL via INTRAVENOUS

## 2018-01-18 MED ORDER — PROPOFOL 500 MG/50ML IV EMUL
INTRAVENOUS | Status: AC
  Filled 2018-01-18: qty 50

## 2018-01-18 MED ORDER — PROPOFOL 10 MG/ML IV BOLUS
INTRAVENOUS | Status: DC | PRN
  Administered 2018-01-18: 10 mg via INTRAVENOUS
  Administered 2018-01-18 (×2): 20 mg via INTRAVENOUS

## 2018-01-18 MED ORDER — MIDAZOLAM HCL 2 MG/2ML IJ SOLN
INTRAMUSCULAR | Status: AC
  Filled 2018-01-18: qty 2

## 2018-01-18 MED ORDER — LIDOCAINE HCL (PF) 2 % IJ SOLN
INTRAMUSCULAR | Status: DC | PRN
  Administered 2018-01-18: 60 mg

## 2018-01-18 MED ORDER — FENTANYL CITRATE (PF) 100 MCG/2ML IJ SOLN
INTRAMUSCULAR | Status: AC
  Filled 2018-01-18: qty 2

## 2018-01-18 MED ORDER — MIDAZOLAM HCL 5 MG/5ML IJ SOLN
INTRAMUSCULAR | Status: DC | PRN
  Administered 2018-01-18: 2 mg via INTRAVENOUS

## 2018-01-18 MED ORDER — SODIUM CHLORIDE 0.9 % IV SOLN: INTRAVENOUS | Status: DC

## 2018-01-18 MED ORDER — PHENYLEPHRINE HCL 10 MG/ML IJ SOLN
INTRAMUSCULAR | Status: AC
  Filled 2018-01-18: qty 1

## 2018-01-18 MED ORDER — PROPOFOL 500 MG/50ML IV EMUL
INTRAVENOUS | Status: DC | PRN
  Administered 2018-01-18: 50 ug/kg/min via INTRAVENOUS

## 2018-01-18 MED ORDER — LIDOCAINE HCL (PF) 2 % IJ SOLN
INTRAMUSCULAR | Status: AC
  Filled 2018-01-18: qty 10

## 2018-01-18 MED ORDER — EPHEDRINE SULFATE 50 MG/ML IJ SOLN
INTRAMUSCULAR | Status: AC
  Filled 2018-01-18: qty 1

## 2018-01-18 MED ORDER — FENTANYL CITRATE (PF) 100 MCG/2ML IJ SOLN
INTRAMUSCULAR | Status: DC | PRN
  Administered 2018-01-18 (×2): 50 ug via INTRAVENOUS

## 2018-01-18 NOTE — Transfer of Care (Signed)
Immediate Anesthesia Transfer of Care Note  Patient: Laurie Watson  Procedure(s) Performed: COLONOSCOPY WITH PROPOFOL (N/A )  Patient Location: PACU  Anesthesia Type:General  Level of Consciousness: sedated  Airway & Oxygen Therapy: Patient Spontanous Breathing and Patient connected to nasal cannula oxygen  Post-op Assessment: Report given to RN and Post -op Vital signs reviewed and stable  Post vital signs: Reviewed and stable  Last Vitals:  Vitals Value Taken Time  BP 131/72 01/18/2018  8:48 AM  Temp    Pulse 66 01/18/2018  8:48 AM  Resp 14 01/18/2018  8:48 AM  SpO2 97 % 01/18/2018  8:48 AM  Vitals shown include unvalidated device data.  Last Pain:  Vitals:   01/18/18 0744  TempSrc: Tympanic  PainSc: 10-Worst pain ever         Complications: No apparent anesthesia complications

## 2018-01-18 NOTE — Anesthesia Post-op Follow-up Note (Signed)
Anesthesia QCDR form completed.        

## 2018-01-18 NOTE — Op Note (Addendum)
[facility] Patient Name: Laurie Watson Procedure Date: 01/18/2018 8:18 AM MRN: [id number] Account #: [id number] Date of Birth: 28-Oct-1950 Admit Type: Outpatient Age: 67 Room: [facility] ENDO ROOM 2 Gender: Female Note Status: Finalized Procedure:            Colonoscopy Indications:          Screening in patient at increased risk: Family history                        of 1st-degree relative with colorectal cancer Providers:            Manya Silvas, MD Referring MD:         Mar Daring (Referring MD) Medicines:            Propofol per Anesthesia Complications:        No immediate complications. Procedure:            Pre-Anesthesia Assessment:                       - After reviewing the risks and benefits, the patient                        was deemed in satisfactory condition to undergo the                        procedure.                       After obtaining informed consent, the colonoscope was                        passed under direct vision. Throughout the procedure,                        the patient's blood pressure, pulse, and oxygen                        saturations were monitored continuously. The                        Colonoscope was introduced through the anus and                        advanced to the the cecum, identified by appendiceal                        orifice and ileocecal valve. The colonoscopy was                        somewhat difficult due to significant looping. The                        patient tolerated the procedure well. The quality of                        the bowel preparation was excellent. Findings:      Multiple small and large-mouthed diverticula were found in the sigmoid       colon and descending colon.      Internal hemorrhoids were found during endoscopy. The hemorrhoids were       small and  Grade I (internal hemorrhoids that do not prolapse).      A diminutive polyp was found in the  transverse colon. The polyp was       sessile. The polyp was removed with a jumbo cold forceps. Resection and       retrieval were complete.      The exam was otherwise without abnormality. Impression:           - Diverticulosis in the sigmoid colon and in the                        descending colon.                       - Internal hemorrhoids.                       - One diminutive polyp in the transverse colon, removed                        with a jumbo cold forceps. Resected and retrieved.                       - The examination was otherwise normal. Recommendation:       - Await pathology results. Manya Silvas, MD 01/18/2018 8:48:07 AM This report has been signed electronically. Number of Addenda: 0 Note Initiated On: 01/18/2018 8:18 AM Scope Withdrawal Time: 0 hours 11 minutes 40 seconds  Total Procedure Duration: 0 hours 20 minutes 57 seconds       [facility]

## 2018-01-18 NOTE — H&P (Signed)
Primary Care Physician:  Mar Daring, PA-C Primary Gastroenterologist:  Dr. Vira Agar  Pre-Procedure History & Physical: HPI:  Laurie Watson is a 68 y.o. female is here for an colonoscopy.Last one 05/05/11   Past Medical History:  Diagnosis Date  . Diabetes mellitus without complication ([place])   . GERD (gastroesophageal reflux disease)   . Hyperlipidemia     Past Surgical History:  Procedure Laterality Date  . ANAL FISSURE REPAIR  11/13/2013  . BREAST BIOPSY Right 1990's   Core - neg  . COLONOSCOPY WITH ESOPHAGOGASTRODUODENOSCOPY (EGD)  05/05/2011  . ELBOW SURGERY Left   . EYE SURGERY Left 1972  . KNEE SURGERY Left   . TONSILLECTOMY AND ADENOIDECTOMY  1970  . TUBAL LIGATION      Prior to Admission medications   Medication Sig Start Date End Date Taking? Authorizing Provider  aspirin 81 MG chewable tablet Chew 1 tablet by mouth daily.   Yes [provider]  cholecalciferol (VITAMIN D) 1000 UNITS tablet Take 1 tablet by mouth daily.   Yes [provider]  Coenzyme Q10 (Q-10 CO-ENZYME PO) Take by mouth.   Yes [provider]  docusate sodium (COLACE) 100 MG capsule Take 1 capsule by mouth daily.   Yes [provider]  glucose blood (ONE TOUCH ULTRA TEST) test strip To check blood sugar once daily 11/11/17  Yes Burnette, Clearnce Sorrel, PA-C  lidocaine (LINDAMANTLE) 3 % CREA cream Apply 1 application topically as needed. 03/05/16  Yes Fenton Malling M, PA-C  metFORMIN (GLUCOPHAGE) 500 MG tablet Take 1 tablet (500 mg total) by mouth 2 (two) times daily with a meal. 11/11/17  Yes Burnette, Clearnce Sorrel, PA-C  Omega-3 Fatty Acids (FISH OIL BURP-LESS) 1000 MG CAPS Take 1 capsule by mouth daily.   Yes [provider]  omeprazole (PRILOSEC) 20 MG capsule TAKE ONE CAPSULE BY MOUTH DAILY 11/11/17  Yes Mar Daring, PA-C  [facility] DELICA LANCETS 50I MISC To check blood sugar once daily 03/05/16  Yes Mar Daring, PA-C   Probiotic CAPS Take 1 capsule by mouth daily.   Yes [provider]  simvastatin (ZOCOR) 20 MG tablet TAKE 1 TABLET(20 MG) BY MOUTH DAILY 11/11/17  Yes Mar Daring, PA-C    Allergies as of 11/27/2017 - Review Complete 11/11/2017  Allergen Reaction Noted  . Codeine Nausea Only 07/03/2015  . Penicillins Rash 09/05/2014    Family History  Problem Relation Age of Onset  . Diabetes Mother   . Breast cancer Mother   . Bladder Cancer Mother   . Hypertension Father   . Diabetes Father   . CVA Father   . Diabetes Brother   . Hyperlipidemia Brother   . Heart attack Brother   . Diabetes Brother   . Diabetes Brother     Social History   Socioeconomic History  . Marital status: Married    Spouse name: Not on file  . Number of children: Not on file  . Years of education: Not on file  . Highest education level: Not on file  Occupational History  . Not on file  Social Needs  . Financial resource strain: Not on file  . Food insecurity:    Worry: Not on file    Inability: Not on file  . Transportation needs:    Medical: Not on file    Non-medical: Not on file  Tobacco Use  . Smoking status: Never Smoker  . Smokeless tobacco: Never Used  Substance and Sexual  Activity  . Alcohol use: Yes    Alcohol/week: 0.0 standard drinks    Comment: occasional  . Drug use: No  . Sexual activity: Not on file  Lifestyle  . Physical activity:    Days per week: Not on file    Minutes per session: Not on file  . Stress: Not on file  Relationships  . Social connections:    Talks on phone: Not on file    Gets together: Not on file    Attends religious service: Not on file    Active member of club or organization: Not on file    Attends meetings of clubs or organizations: Not on file    Relationship status: Not on file  . Intimate partner violence:    Fear of current or ex partner: Not on file    Emotionally abused: Not on file    Physically abused: Not on file    Forced  sexual activity: Not on file  Other Topics Concern  . Not on file  Social History Narrative  . Not on file    Review of Systems: See HPI, otherwise negative ROS  Physical Exam: BP (!) 180/79   Pulse 66   Temp (!) 97.5 F (36.4 C) (Tympanic)   Resp 16   Ht 5' 3.5" (1.613 m)   Wt 72.6 kg   SpO2 98%   BMI 27.90 kg/m  General:   Alert,  pleasant and cooperative in NAD Head:  Normocephalic and atraumatic. Neck:  Supple; no masses or thyromegaly. Lungs:  Clear throughout to auscultation.    Heart:  Regular rate and rhythm. Abdomen:  Soft, nontender and nondistended. Normal bowel sounds, without guarding, and without rebound.   Neurologic:  Alert and  oriented x4;  grossly normal neurologically.  Impression/Plan: Laurie Watson is here for an colonoscopy to be performed for FH colon cancer in father who is living now at age 65.  Risks, benefits, limitations, and alternatives regarding  colonoscopy have been reviewed with the patient.  Questions have been answered.  All parties agreeable.   Gaylyn Cheers, MD  01/18/2018, 8:17 AM

## 2018-01-18 NOTE — Anesthesia Preprocedure Evaluation (Signed)
Anesthesia Evaluation  Patient identified by MRN, date of birth, ID band Patient awake    Reviewed: Allergy & Precautions, H&P , NPO status , Patient's Chart, lab work & pertinent test results  History of Anesthesia Complications (+) PONV and history of anesthetic complications  Airway Mallampati: II  TM Distance: <3 FB Neck ROM: limited    Dental  (+) Chipped   Pulmonary neg pulmonary ROS, neg shortness of breath,           Cardiovascular Exercise Tolerance: Good (-) angina(-) Past MI and (-) DOE negative cardio ROS       Neuro/Psych negative neurological ROS  negative psych ROS   GI/Hepatic Neg liver ROS, GERD  Medicated and Controlled,  Endo/Other  diabetes, Type 2  Renal/GU negative Renal ROS  negative genitourinary   Musculoskeletal   Abdominal   Peds  Hematology negative hematology ROS (+)   Anesthesia Other Findings Past Medical History: No date: Diabetes mellitus without complication (HCC) No date: GERD (gastroesophageal reflux disease) No date: Hyperlipidemia  Past Surgical History: 11/13/2013: ANAL FISSURE REPAIR 1990's: BREAST BIOPSY; Right     Comment:  Core - neg 05/05/2011: COLONOSCOPY WITH ESOPHAGOGASTRODUODENOSCOPY (EGD) No date: ELBOW SURGERY; Left 1972: EYE SURGERY; Left No date: KNEE SURGERY; Left 1970: TONSILLECTOMY AND ADENOIDECTOMY No date: TUBAL LIGATION  BMI    Body Mass Index:  27.90 kg/m      Reproductive/Obstetrics negative OB ROS                             Anesthesia Physical Anesthesia Plan  ASA: III  Anesthesia Plan: General   Post-op Pain Management:    Induction: Intravenous  PONV Risk Score and Plan: Propofol infusion and TIVA  Airway Management Planned: Natural Airway and Nasal Cannula  Additional Equipment:   Intra-op Plan:   Post-operative Plan:   Informed Consent: I have reviewed the patients History and Physical,  chart, labs and discussed the procedure including the risks, benefits and alternatives for the proposed anesthesia with the patient or authorized representative who has indicated his/her understanding and acceptance.   Dental Advisory Given  Plan Discussed with: Anesthesiologist, CRNA and Surgeon  Anesthesia Plan Comments: (Patient consented for risks of anesthesia including but not limited to:  - adverse reactions to medications - risk of intubation if required - damage to teeth, lips or other oral mucosa - sore throat or hoarseness - Damage to heart, brain, lungs or loss of life  Patient voiced understanding.)        Anesthesia Quick Evaluation

## 2018-01-18 NOTE — Anesthesia Postprocedure Evaluation (Signed)
Anesthesia Post Note  Patient: Laurie Watson  Procedure(s) Performed: COLONOSCOPY WITH PROPOFOL (N/A )  Patient location during evaluation: Endoscopy Anesthesia Type: General Level of consciousness: awake and alert Pain management: pain level controlled Vital Signs Assessment: post-procedure vital signs reviewed and stable Respiratory status: spontaneous breathing, nonlabored ventilation, respiratory function stable and patient connected to nasal cannula oxygen Cardiovascular status: blood pressure returned to baseline and stable Postop Assessment: no apparent nausea or vomiting Anesthetic complications: no     Last Vitals:  Vitals:   01/18/18 0744 01/18/18 0849  BP: (!) 180/79   Pulse: 66   Resp: 16   Temp: (!) 36.4 C (!) 36.1 C  SpO2: 98%     Last Pain:  Vitals:   01/18/18 0913  TempSrc:   PainSc: 9                  Evadean Sproule K Misbah Hornaday

## 2018-01-19 ENCOUNTER — Encounter: Payer: Self-pay | Admitting: Unknown Physician Specialty

## 2018-01-19 DIAGNOSIS — M79672 Pain in left foot: Secondary | ICD-10-CM | POA: Diagnosis not present

## 2018-01-19 DIAGNOSIS — M6528 Calcific tendinitis, other site: Secondary | ICD-10-CM | POA: Diagnosis not present

## 2018-01-19 DIAGNOSIS — M7731 Calcaneal spur, right foot: Secondary | ICD-10-CM | POA: Diagnosis not present

## 2018-01-19 LAB — SURGICAL PATHOLOGY

## 2018-02-04 ENCOUNTER — Telehealth: Payer: Self-pay | Admitting: Physician Assistant

## 2018-02-04 NOTE — Telephone Encounter (Signed)
Pt dropped off pre-op clearance form for surgery she is having on 03/12/18. Pt stated forms can not be completed before 02/10/18. Pt stated that if she needs and OV she will need to come in before 02/19/18 because she is going out of town on 02/19/18 and won't return until just before her surgery date. Pt stated that if she doesn't need OV pt is requesting we call her to let her know when the form has been faxed. Please advise. Thanks TNP

## 2018-02-05 NOTE — Telephone Encounter (Signed)
Form completed and given to Josie to fax

## 2018-02-08 NOTE — Telephone Encounter (Signed)
Will faxed forms after 11/20 per patient request.

## 2018-02-15 ENCOUNTER — Telehealth: Payer: Self-pay

## 2018-02-15 NOTE — Telephone Encounter (Signed)
Faced today. -JER

## 2018-02-15 NOTE — Telephone Encounter (Signed)
Patient states she dropped off a surgical clearance form about 10 days ago. Patient calling to get an updated status on this, and to make sure it was faxed to Dr. Selina Cooley office.

## 2018-02-22 ENCOUNTER — Other Ambulatory Visit: Payer: Self-pay | Admitting: Podiatry

## 2018-02-22 DIAGNOSIS — M6528 Calcific tendinitis, other site: Secondary | ICD-10-CM | POA: Diagnosis not present

## 2018-02-22 DIAGNOSIS — M7731 Calcaneal spur, right foot: Secondary | ICD-10-CM | POA: Diagnosis not present

## 2018-02-22 DIAGNOSIS — M79672 Pain in left foot: Secondary | ICD-10-CM | POA: Diagnosis not present

## 2018-03-08 ENCOUNTER — Inpatient Hospital Stay: Admission: RE | Admit: 2018-03-08 | Payer: Medicare Other | Source: Ambulatory Visit

## 2018-03-08 ENCOUNTER — Other Ambulatory Visit: Payer: Self-pay

## 2018-03-08 ENCOUNTER — Encounter: Payer: Self-pay | Admitting: *Deleted

## 2018-03-12 ENCOUNTER — Ambulatory Visit
Admission: RE | Admit: 2018-03-12 | Discharge: 2018-03-12 | Disposition: A | Discharge status: Home / Self Care | Payer: Medicare Other | Attending: Podiatry | Admitting: Podiatry

## 2018-03-12 ENCOUNTER — Ambulatory Visit: Payer: Medicare Other | Admitting: Anesthesiology

## 2018-03-12 ENCOUNTER — Encounter
Admission: RE | Disposition: A | Discharge status: Home / Self Care | Payer: Self-pay | Source: Home / Self Care | Attending: Podiatry

## 2018-03-12 DIAGNOSIS — M899 Disorder of bone, unspecified: Secondary | ICD-10-CM | POA: Insufficient documentation

## 2018-03-12 DIAGNOSIS — Z7984 Long term (current) use of oral hypoglycemic drugs: Secondary | ICD-10-CM | POA: Insufficient documentation

## 2018-03-12 DIAGNOSIS — Z7982 Long term (current) use of aspirin: Secondary | ICD-10-CM | POA: Diagnosis not present

## 2018-03-12 DIAGNOSIS — M65271 Calcific tendinitis, right ankle and foot: Secondary | ICD-10-CM | POA: Diagnosis not present

## 2018-03-12 DIAGNOSIS — Z79899 Other long term (current) drug therapy: Secondary | ICD-10-CM | POA: Insufficient documentation

## 2018-03-12 DIAGNOSIS — Z885 Allergy status to narcotic agent status: Secondary | ICD-10-CM | POA: Insufficient documentation

## 2018-03-12 DIAGNOSIS — G8918 Other acute postprocedural pain: Secondary | ICD-10-CM | POA: Diagnosis not present

## 2018-03-12 DIAGNOSIS — K449 Diaphragmatic hernia without obstruction or gangrene: Secondary | ICD-10-CM | POA: Insufficient documentation

## 2018-03-12 DIAGNOSIS — E785 Hyperlipidemia, unspecified: Secondary | ICD-10-CM | POA: Insufficient documentation

## 2018-03-12 DIAGNOSIS — Z880 Allergy status to penicillin: Secondary | ICD-10-CM | POA: Insufficient documentation

## 2018-03-12 DIAGNOSIS — M79671 Pain in right foot: Secondary | ICD-10-CM | POA: Diagnosis not present

## 2018-03-12 DIAGNOSIS — M7731 Calcaneal spur, right foot: Secondary | ICD-10-CM | POA: Diagnosis not present

## 2018-03-12 DIAGNOSIS — E119 Type 2 diabetes mellitus without complications: Secondary | ICD-10-CM | POA: Diagnosis not present

## 2018-03-12 DIAGNOSIS — M7661 Achilles tendinitis, right leg: Secondary | ICD-10-CM | POA: Diagnosis not present

## 2018-03-12 DIAGNOSIS — G8929 Other chronic pain: Secondary | ICD-10-CM | POA: Diagnosis not present

## 2018-03-12 DIAGNOSIS — K219 Gastro-esophageal reflux disease without esophagitis: Secondary | ICD-10-CM | POA: Diagnosis not present

## 2018-03-12 HISTORY — PX: CALCANEAL OSTEOTOMY: SHX1281

## 2018-03-12 HISTORY — DX: Family history of other specified conditions: Z84.89

## 2018-03-12 HISTORY — DX: Nausea with vomiting, unspecified: R11.2

## 2018-03-12 HISTORY — DX: Other specified postprocedural states: Z98.890

## 2018-03-12 HISTORY — PX: ACHILLES TENDON SURGERY: SHX542

## 2018-03-12 HISTORY — DX: Personal history of other diseases of the digestive system: Z87.19

## 2018-03-12 LAB — GLUCOSE, CAPILLARY
Glucose-Capillary: 155 mg/dL — ABNORMAL HIGH (ref 70–99)
Glucose-Capillary: 148 mg/dL — ABNORMAL HIGH (ref 70–99)

## 2018-03-12 SURGERY — REPAIR, TENDON, ACHILLES
Anesthesia: General | Site: Foot | Laterality: Right

## 2018-03-12 MED ORDER — LACTATED RINGERS IV SOLN
10.0000 mL/h | INTRAVENOUS | Status: DC
  Administered 2018-03-12 (×2): via INTRAVENOUS

## 2018-03-12 MED ORDER — GLYCOPYRROLATE 0.2 MG/ML IJ SOLN
INTRAMUSCULAR | Status: DC | PRN
  Administered 2018-03-12: 0.2 mg via INTRAVENOUS

## 2018-03-12 MED ORDER — LACTATED RINGERS IV SOLN: INTRAVENOUS | Status: DC

## 2018-03-12 MED ORDER — PROPOFOL 10 MG/ML IV BOLUS
INTRAVENOUS | Status: DC | PRN
  Administered 2018-03-12: 200 mg via INTRAVENOUS
  Administered 2018-03-12 (×2): 20 mg via INTRAVENOUS

## 2018-03-12 MED ORDER — PROMETHAZINE HCL 25 MG/ML IJ SOLN: 6.2500 mg | INTRAMUSCULAR | Status: DC | PRN

## 2018-03-12 MED ORDER — FENTANYL CITRATE (PF) 100 MCG/2ML IJ SOLN
25.0000 ug | INTRAMUSCULAR | Status: DC | PRN
  Administered 2018-03-12: 25 ug via INTRAVENOUS

## 2018-03-12 MED ORDER — MIDAZOLAM HCL 2 MG/2ML IJ SOLN
1.0000 mg | INTRAMUSCULAR | Status: DC | PRN
  Administered 2018-03-12: 1 mg via INTRAVENOUS

## 2018-03-12 MED ORDER — ONDANSETRON HCL 4 MG PO TABS: 4.0000 mg | ORAL_TABLET | Freq: Three times a day (TID) | ORAL | Status: DC | PRN

## 2018-03-12 MED ORDER — FENTANYL CITRATE (PF) 100 MCG/2ML IJ SOLN
50.0000 ug | INTRAMUSCULAR | Status: DC | PRN
  Administered 2018-03-12: 50 ug via INTRAVENOUS
  Administered 2018-03-12: 100 ug via INTRAVENOUS

## 2018-03-12 MED ORDER — ONDANSETRON HCL 4 MG/2ML IJ SOLN
INTRAMUSCULAR | Status: DC | PRN
  Administered 2018-03-12: 4 mg via INTRAVENOUS

## 2018-03-12 MED ORDER — ACETAMINOPHEN 325 MG PO TABS
650.0000 mg | ORAL_TABLET | Freq: Once | ORAL | Status: AC
  Administered 2018-03-12: 650 mg via ORAL

## 2018-03-12 MED ORDER — LIDOCAINE HCL (CARDIAC) PF 100 MG/5ML IV SOSY
PREFILLED_SYRINGE | INTRAVENOUS | Status: DC | PRN
  Administered 2018-03-12: 40 mg via INTRAVENOUS

## 2018-03-12 MED ORDER — OXYCODONE-ACETAMINOPHEN 7.5-325 MG PO TABS: 1.0000 | ORAL_TABLET | ORAL | 0 refills | Status: DC | PRN

## 2018-03-12 MED ORDER — POVIDONE-IODINE 7.5 % EX SOLN: Freq: Once | CUTANEOUS | Status: DC

## 2018-03-12 MED ORDER — CLINDAMYCIN PHOSPHATE 900 MG/50ML IV SOLN
900.0000 mg | INTRAVENOUS | Status: AC
  Administered 2018-03-12: 900 mg via INTRAVENOUS

## 2018-03-12 SURGICAL SUPPLY — 44 items
ANCHOR JUGGERKNOT WTAP NDL 2.9 (Anchor) ×2 IMPLANT
BANDAGE ELASTIC 4 LF NS (GAUZE/BANDAGES/DRESSINGS) ×4 IMPLANT
BIT DRILL JUGRKNT W/NDL BIT2.9 (DRILL) ×1 IMPLANT
BLADE MED AGGRESSIVE (BLADE) ×2 IMPLANT
BLADE SURG 15 STRL LF DISP TIS (BLADE) ×1 IMPLANT
BLADE SURG 15 STRL SS (BLADE) ×1
BNDG ESMARK 4X12 TAN STRL LF (GAUZE/BANDAGES/DRESSINGS) ×2 IMPLANT
BNDG GAUZE 4.5X4.1 6PLY STRL (MISCELLANEOUS) ×2 IMPLANT
BNDG STRETCH 4X75 STRL LF (GAUZE/BANDAGES/DRESSINGS) ×2 IMPLANT
CANISTER SUCT 1200ML W/VALVE (MISCELLANEOUS) ×2 IMPLANT
COVER LIGHT HANDLE UNIVERSAL (MISCELLANEOUS) ×4 IMPLANT
CUFF TOURN SGL QUICK 24 (TOURNIQUET CUFF) ×1
CUFF TOURN SGL QUICK 30 (MISCELLANEOUS)
CUFF TOURN SGL QUICK 34 (TOURNIQUET CUFF)
CUFF TRNQT CYL 24X4X40X1 (TOURNIQUET CUFF) ×1 IMPLANT
CUFF TRNQT CYL 34X4X40X1 (TOURNIQUET CUFF) IMPLANT
CUFF TRNQT CYL LO 30X4X (MISCELLANEOUS) IMPLANT
DRILL JUGGERKNOT W/NDL BIT 2.9 (DRILL) ×2
DURAPREP 26ML APPLICATOR (WOUND CARE) ×2 IMPLANT
ELECT REM PT RETURN 9FT ADLT (ELECTROSURGICAL) ×2
ELECTRODE REM PT RTRN 9FT ADLT (ELECTROSURGICAL) ×1 IMPLANT
GAUZE PETRO XEROFOAM 1X8 (MISCELLANEOUS) ×2 IMPLANT
GAUZE SPONGE 4X4 12PLY STRL (GAUZE/BANDAGES/DRESSINGS) ×2 IMPLANT
GLOVE BIO SURGEON STRL SZ8 (GLOVE) ×6 IMPLANT
GOWN STRL REUS W/ TWL LRG LVL3 (GOWN DISPOSABLE) ×1 IMPLANT
GOWN STRL REUS W/ TWL XL LVL3 (GOWN DISPOSABLE) ×1 IMPLANT
GOWN STRL REUS W/TWL LRG LVL3 (GOWN DISPOSABLE) ×1
GOWN STRL REUS W/TWL XL LVL3 (GOWN DISPOSABLE) ×1
KIT TURNOVER KIT A (KITS) ×2 IMPLANT
NS IRRIG 500ML POUR BTL (IV SOLUTION) ×2 IMPLANT
PACK EXTREMITY ARMC (MISCELLANEOUS) ×2 IMPLANT
PADDING CAST BLEND 4X4 NS (MISCELLANEOUS) ×6 IMPLANT
PENCIL SMOKE EVACUATOR (MISCELLANEOUS) ×2 IMPLANT
RASP SM TEAR CROSS CUT (RASP) ×2 IMPLANT
SPLINT CAST 1 STEP 4X30 (MISCELLANEOUS) ×2 IMPLANT
SPLINT FAST PLASTER 5X30 (CAST SUPPLIES) ×1
SPLINT PLASTER CAST FAST 5X30 (CAST SUPPLIES) ×1 IMPLANT
STOCKINETTE STRL 6IN 960660 (GAUZE/BANDAGES/DRESSINGS) ×2 IMPLANT
STRIP CLOSURE SKIN 1/4X4 (GAUZE/BANDAGES/DRESSINGS) ×2 IMPLANT
SUT VIC AB 2-0 SH 27 (SUTURE) ×1
SUT VIC AB 2-0 SH 27XBRD (SUTURE) ×1 IMPLANT
SUT VIC AB 3-0 SH 27 (SUTURE)
SUT VIC AB 3-0 SH 27X BRD (SUTURE) IMPLANT
SUT VIC AB 4-0 FS2 27 (SUTURE) ×2 IMPLANT

## 2018-03-12 NOTE — H&P (Signed)
H and P has been reviewed and no changes are noted.  

## 2018-03-12 NOTE — Op Note (Signed)
Operative note   Surgeon: Dr. Albertine Odilia, DPM.    Assistant: None    Preop diagnosis: 1.  Right chronic Achilles tendinitis 2.  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  Secondary Achilles tendon repair with 3 anchor of the tendon using the juggernaut tendon anchor system   2.  Resection of tendon calcification-hypertrophied tendon as well as calcaneal exostosis      EBL: Less than 10 cc    Anesthesia:general delivered by the anesthesia team with a popliteal block delivered by anesthesia team as well    Hemostasis: Thigh tourniquet at 52 minutes at 300 mmHg pressure    Specimen: Tendon calcinosis right heel    Complications: None    Operative indications: Chronic pain unresponsive to conservative care    Procedure:  Patient was brought into the OR and placed on the operating table in theprone position. After anesthesia was obtained theright lower extremity was prepped and draped in usual sterile fashion.  Operative Report: This time attention was directed posterior right heel where a 4 cm linear incision was made along the midline of the posterior calcaneus this was deepened sharp blunt dissection.  Different levels of fascia were carefully incised and retracted medial laterally to for appropriate coverage over the sutures.  The tendon sheath peritenon proximally was then identified and incised longitudinally reflected medial laterally.  The tendon was then incised longitudinally on the midline.  This carried down to bone.  This was reflected away from the medial lateral aspects of the bone proliferation on the posterior calcaneus.  Once this was freed up enough a combination of a sagittal saw and power rasp were used to remove all prolific bone and tendon calcification.  The posterior superior aspect had also prominence to the bone this was resected and rasped mostly also.  After hypertrophied soft tissue was removed  from the region the area was then copiously irrigated.  Nitrol bone was achieved on the posterior calcaneus.  There is checked FluoroScan all prolific bone and bone prominences and spurring were removed appropriately based on FluoroScan.  This time after copious irrigation a tendon anchor system was placed into the calcaneus it was a 2.9 juggernaut system.  Had to separate sutures coming off and this was used to suture the tendon and sync it down to the bone and a nice tight relationship.  Once both these tendon anchors had been sutured through the tendon anchor down to the bone a 2-0 Vicryl was used to repair the proximal incised portion of the tendon and also used to attach distal portions of the tendon to the soft tissue distally.  This was done with a continuous stitch.  Once again after irrigation the peritenon and tendon sheath were closed over the tendon and sutures.  The deep superficial fascia were closed with 4-0 Vicryl in continuous stitch.  Skin was closed with 4-0 Vicryl subcuticular stitch.  This time sterile compressive dressings were placed across wound consisting of Steri-Strips Xeroform gauze 4 x 4's Kling Kerlix and the tourniquet was released.  Prior to complete vascular seen return to all digits of the right foot.  A posterior splint is placed on the right foot leg in the operating room.    Patient tolerated the procedure and anesthesia well.  Was transported from the OR to the PACU with all vital signs stable and vascular status intact. To be discharged per routine protocol.  Will follow  up in approximately 1 week in the outpatient clinic.

## 2018-03-12 NOTE — Anesthesia Procedure Notes (Addendum)
Procedure Name: Intubation Date/Time: 03/12/2018 7:42 AM Performed by: Jeannene Patella, CRNA Pre-anesthesia Checklist: Patient identified, Emergency Drugs available, Suction available, Patient being monitored and Timeout performed Patient Re-evaluated:Patient Re-evaluated prior to induction Oxygen Delivery Method: Circle system utilized Preoxygenation: Pre-oxygenation with 100% oxygen Induction Type: IV induction Ventilation: Mask ventilation without difficulty Laryngoscope Size: Miller and 2 Grade View: Grade I Tube type: Oral Tube size: 7.0 mm Number of attempts: 1 Secured at: 21 cm Dental Injury: Teeth and Oropharynx as per pre-operative assessment

## 2018-03-12 NOTE — Transfer of Care (Signed)
Immediate Anesthesia Transfer of Care Note  Patient: Laurie Watson  Procedure(s) Performed: ACHILLES TENDON REPAIR-SECONDARY (Right Foot) PARTIAL CALCANECTOMY (Right Foot)  Patient Location: PACU  Anesthesia Type: General  Level of Consciousness: awake, alert  and patient cooperative  Airway and Oxygen Therapy: Patient Spontanous Breathing and Patient connected to supplemental oxygen  Post-op Assessment: Post-op Vital signs reviewed, Patient's Cardiovascular Status Stable, Respiratory Function Stable, Patent Airway and No signs of Nausea or vomiting  Post-op Vital Signs: Reviewed and stable  Complications: No apparent anesthesia complications

## 2018-03-12 NOTE — Anesthesia Postprocedure Evaluation (Signed)
Anesthesia Post Note  Patient: Laurie Watson  Procedure(s) Performed: ACHILLES TENDON REPAIR-SECONDARY (Right Foot) PARTIAL CALCANECTOMY (Right Foot)  Patient location during evaluation: PACU Anesthesia Type: General Level of consciousness: awake and alert Pain management: pain level controlled Vital Signs Assessment: post-procedure vital signs reviewed and stable Respiratory status: spontaneous breathing, nonlabored ventilation, respiratory function stable and patient connected to nasal cannula oxygen Cardiovascular status: blood pressure returned to baseline and stable Postop Assessment: no apparent nausea or vomiting Anesthetic complications: no    [name] C

## 2018-03-12 NOTE — Anesthesia Procedure Notes (Signed)
Anesthesia Regional Block: Popliteal block   Pre-Anesthetic Checklist: ,, timeout performed, Correct Patient, Correct Site, Correct Laterality, Correct Procedure, Correct Position, site marked, Risks and benefits discussed,  Surgical consent,  Pre-op evaluation,  At surgeon's request and post-op pain management  Laterality: Right  Prep: chloraprep       Needles:  Injection technique: Single-shot  Needle Type: Echogenic Needle     Needle Length: 9cm  Needle Gauge: 21     Additional Needles:   Procedures:,,,, ultrasound used (permanent image in chart),,,,  Narrative:  Injection made incrementally with aspirations every 5 mL.  Performed by: Personally   Additional Notes: Functioning IV was confirmed and monitors applied. Ultrasound guidance: relevant anatomy identified, needle position confirmed, local anesthetic spread visualized around nerve(s)., vascular puncture avoided.  Image printed for medical record.  Negative aspiration and no paresthesias; incremental administration of local anesthetic. The patient tolerated the procedure well. Vitals signes recorded in RN notes.

## 2018-03-12 NOTE — Discharge Instructions (Signed)
[place] DR. Seibert   1. Take your medication as prescribed.  Pain medication should be taken only as needed.  2. Keep the dressing clean, dry and intact.  3. Keep your foot elevated above the heart level for the first 48 hours.  4.   Stay nonweightbearing.  Use your knee rest scooter or crutches or walker or wheelchair in order to accomplish this.  No weightbearing at all on the right side.  4.   5. Do not take a shower. Baths are permissible as long as the foot is kept out of the water.  If you buy a shower protector to keep your foot dry after a few days you can sit down and take a shower but be very careful in the moist environment.  6. Every hour you are awake:  - Bend your knee 15 times. - Flex foot 15 times - Massage calf 15 times  7. Call [facility] [phone]) if any of the following problems occur: - You develop a temperature or fever. - The bandage becomes saturated with blood. - Medication does not stop your pain. - Injury of the foot occurs. - Any symptoms of infection including redness, odor, or red streaks running from wound.   General Anesthesia, Adult, Care After These instructions provide you with information about caring for yourself after your procedure. Your health care provider may also give you more specific instructions. Your treatment has been planned according to current medical practices, but problems sometimes occur. Call your health care provider if you have any problems or questions after your procedure. What can I expect after the procedure? After the procedure, it is common to have:  Vomiting.  A sore throat.  Mental slowness.  It is common to feel:  Nauseous.  Cold or shivery.  Sleepy.  Tired.  Sore or achy, even in parts of your body where you did not have surgery.  Follow these instructions  at home: For at least 24 hours after the procedure:  Do not: ? Participate in activities where you could fall or become injured. ? Drive. ? Use heavy machinery. ? Drink alcohol. ? Take sleeping pills or medicines that cause drowsiness. ? Make important decisions or sign legal documents. ? Take care of children on your own.  Rest. Eating and drinking  If you vomit, drink water, juice, or soup when you can drink without vomiting.  Drink enough fluid to keep your urine clear or pale yellow.  Make sure you have little or no nausea before eating solid foods.  Follow the diet recommended by your health care provider. General instructions  Have a responsible adult stay with you until you are awake and alert.  Return to your normal activities as told by your health care provider. Ask your health care provider what activities are safe for you.  Take over-the-counter and prescription medicines only as told by your health care provider.  If you smoke, do not smoke without supervision.  Keep all follow-up visits as told by your health care provider. This is important. Contact a health care provider if:  You continue to have nausea or vomiting at home, and medicines are not helpful.  You cannot drink fluids or start eating again.  You cannot urinate after 8-12 hours.  You develop a skin rash.  You have fever.  You have increasing redness at the site of your procedure. Get help  right away if:  You have difficulty breathing.  You have chest pain.  You have unexpected bleeding.  You feel that you are having a life-threatening or urgent problem. This information is not intended to replace advice given to you by your health care provider. Make sure you discuss any questions you have with your health care provider. Document Released: 06/16/2000 Document Revised: 08/13/2015 Document Reviewed: 02/22/2015 Elsevier Interactive Patient Education  [employer].

## 2018-03-12 NOTE — Anesthesia Preprocedure Evaluation (Addendum)
Anesthesia Evaluation    History of Anesthesia Complications (+) PONV  Airway Mallampati: II  TM Distance: >3 FB Neck ROM: Full    Dental no notable dental hx.    Pulmonary    Pulmonary exam normal breath sounds clear to auscultation       Cardiovascular negative cardio ROS Normal cardiovascular exam Rhythm:Regular Rate:Normal     Neuro/Psych    GI/Hepatic hiatal hernia, GERD  Medicated,  Endo/Other  diabetes, Type 2  Renal/GU   negative genitourinary   Musculoskeletal negative musculoskeletal ROS (+)   Abdominal   Peds negative pediatric ROS (+)  Hematology   Anesthesia Other Findings   Reproductive/Obstetrics negative OB ROS                            Anesthesia Physical Anesthesia Plan  ASA: II  Anesthesia Plan: General   Post-op Pain Management:  Regional for Post-op pain   Induction: Intravenous  PONV Risk Score and Plan: 3 and Midazolam, Ondansetron and Promethazine  Airway Management Planned: LMA  Additional Equipment:   Intra-op Plan:   Post-operative Plan: Extubation in OR  Informed Consent: I have reviewed the patients History and Physical, chart, labs and discussed the procedure including the risks, benefits and alternatives for the proposed anesthesia with the patient or authorized representative who has indicated his/her understanding and acceptance.   Dental advisory given  Plan Discussed with: CRNA  Anesthesia Plan Comments:         Anesthesia Quick Evaluation

## 2018-03-16 LAB — SURGICAL PATHOLOGY

## 2018-03-31 DIAGNOSIS — M7731 Calcaneal spur, right foot: Secondary | ICD-10-CM | POA: Diagnosis not present

## 2018-04-26 ENCOUNTER — Other Ambulatory Visit: Payer: Self-pay | Admitting: Physician Assistant

## 2018-04-26 DIAGNOSIS — K219 Gastro-esophageal reflux disease without esophagitis: Secondary | ICD-10-CM

## 2018-04-26 DIAGNOSIS — E7800 Pure hypercholesterolemia, unspecified: Secondary | ICD-10-CM

## 2018-04-26 DIAGNOSIS — E119 Type 2 diabetes mellitus without complications: Secondary | ICD-10-CM

## 2018-05-18 DIAGNOSIS — M25571 Pain in right ankle and joints of right foot: Secondary | ICD-10-CM | POA: Diagnosis not present

## 2018-05-18 DIAGNOSIS — M6281 Muscle weakness (generalized): Secondary | ICD-10-CM | POA: Diagnosis not present

## 2018-05-18 DIAGNOSIS — M25671 Stiffness of right ankle, not elsewhere classified: Secondary | ICD-10-CM | POA: Diagnosis not present

## 2018-05-21 DIAGNOSIS — M6281 Muscle weakness (generalized): Secondary | ICD-10-CM | POA: Diagnosis not present

## 2018-05-25 DIAGNOSIS — M6281 Muscle weakness (generalized): Secondary | ICD-10-CM | POA: Diagnosis not present

## 2018-05-27 DIAGNOSIS — M6281 Muscle weakness (generalized): Secondary | ICD-10-CM | POA: Diagnosis not present

## 2018-06-01 DIAGNOSIS — M6281 Muscle weakness (generalized): Secondary | ICD-10-CM | POA: Diagnosis not present

## 2018-07-22 ENCOUNTER — Other Ambulatory Visit: Payer: Self-pay | Admitting: Physician Assistant

## 2018-07-22 DIAGNOSIS — E7800 Pure hypercholesterolemia, unspecified: Secondary | ICD-10-CM

## 2018-08-04 LAB — HM DIABETES EYE EXAM

## 2018-08-12 DIAGNOSIS — Z85828 Personal history of other malignant neoplasm of skin: Secondary | ICD-10-CM | POA: Diagnosis not present

## 2018-08-12 DIAGNOSIS — D2272 Melanocytic nevi of left lower limb, including hip: Secondary | ICD-10-CM | POA: Diagnosis not present

## 2018-08-12 DIAGNOSIS — D2261 Melanocytic nevi of right upper limb, including shoulder: Secondary | ICD-10-CM | POA: Diagnosis not present

## 2018-08-12 DIAGNOSIS — D2262 Melanocytic nevi of left upper limb, including shoulder: Secondary | ICD-10-CM | POA: Diagnosis not present

## 2018-08-12 DIAGNOSIS — D225 Melanocytic nevi of trunk: Secondary | ICD-10-CM | POA: Diagnosis not present

## 2018-08-12 DIAGNOSIS — L820 Inflamed seborrheic keratosis: Secondary | ICD-10-CM | POA: Diagnosis not present

## 2018-08-12 DIAGNOSIS — Z08 Encounter for follow-up examination after completed treatment for malignant neoplasm: Secondary | ICD-10-CM | POA: Diagnosis not present

## 2018-08-12 DIAGNOSIS — L538 Other specified erythematous conditions: Secondary | ICD-10-CM | POA: Diagnosis not present

## 2018-08-12 DIAGNOSIS — R202 Paresthesia of skin: Secondary | ICD-10-CM | POA: Diagnosis not present

## 2018-08-12 DIAGNOSIS — D2271 Melanocytic nevi of right lower limb, including hip: Secondary | ICD-10-CM | POA: Diagnosis not present

## 2018-08-12 DIAGNOSIS — R208 Other disturbances of skin sensation: Secondary | ICD-10-CM | POA: Diagnosis not present

## 2018-09-21 DIAGNOSIS — M9903 Segmental and somatic dysfunction of lumbar region: Secondary | ICD-10-CM | POA: Diagnosis not present

## 2018-09-21 DIAGNOSIS — S335XXA Sprain of ligaments of lumbar spine, initial encounter: Secondary | ICD-10-CM | POA: Diagnosis not present

## 2018-10-14 ENCOUNTER — Other Ambulatory Visit: Payer: Self-pay | Admitting: Physician Assistant

## 2018-10-14 DIAGNOSIS — K219 Gastro-esophageal reflux disease without esophagitis: Secondary | ICD-10-CM

## 2018-10-14 DIAGNOSIS — E119 Type 2 diabetes mellitus without complications: Secondary | ICD-10-CM

## 2018-11-13 IMAGING — MG MM DIGITAL SCREENING BILAT W/ CAD
4 series · 4 of 4 positions shown · non-contrast
Comparison: Previous exam(s).

CLINICAL DATA: Screening.

EXAM:
DIGITAL SCREENING BILATERAL MAMMOGRAM WITH CAD

[L MLO]
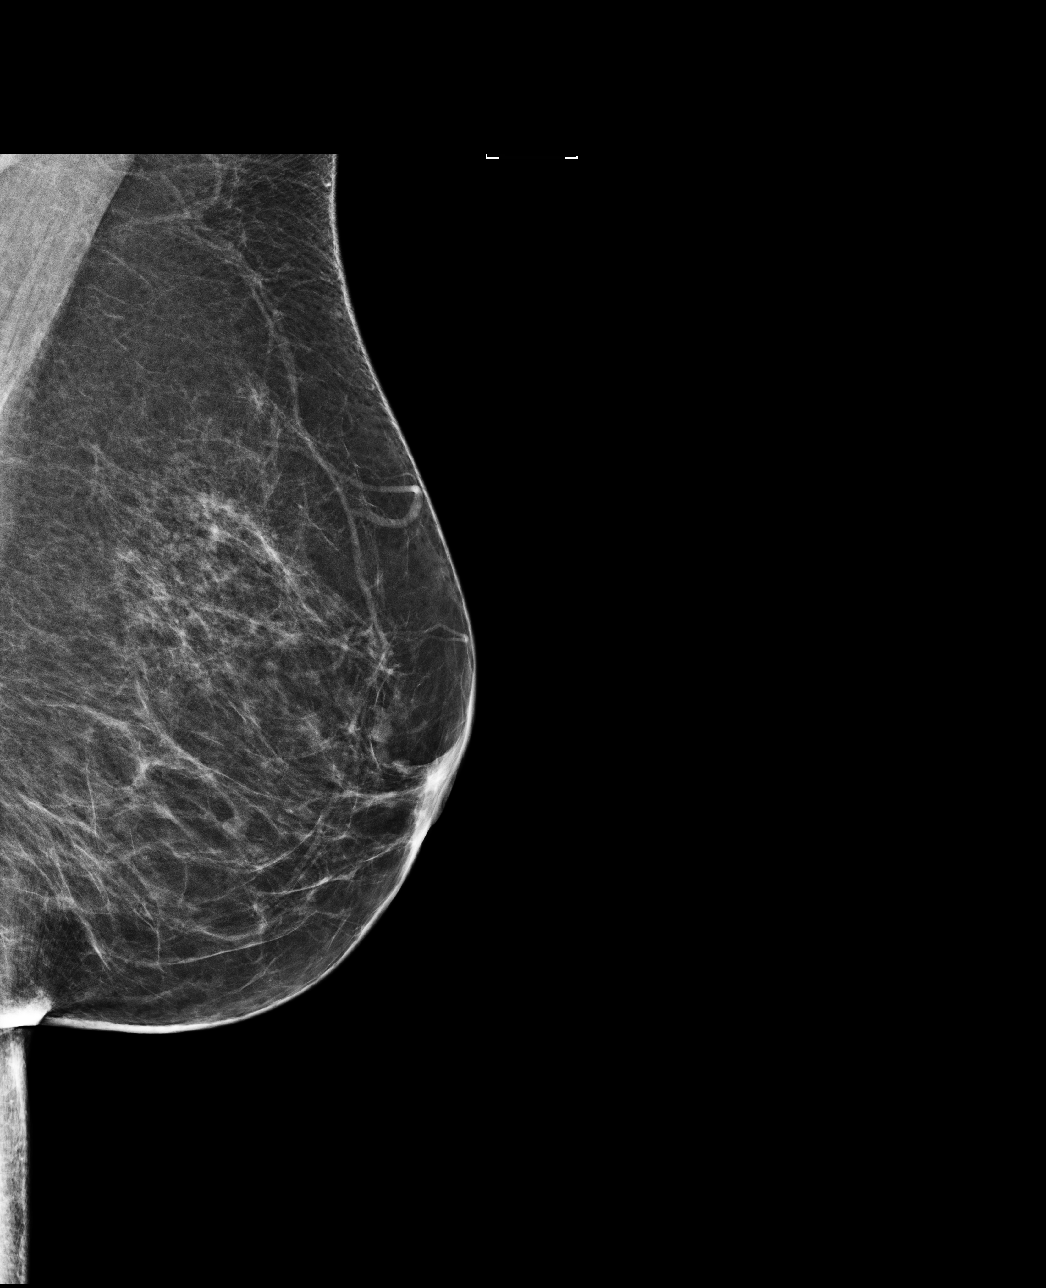

[R MLO]
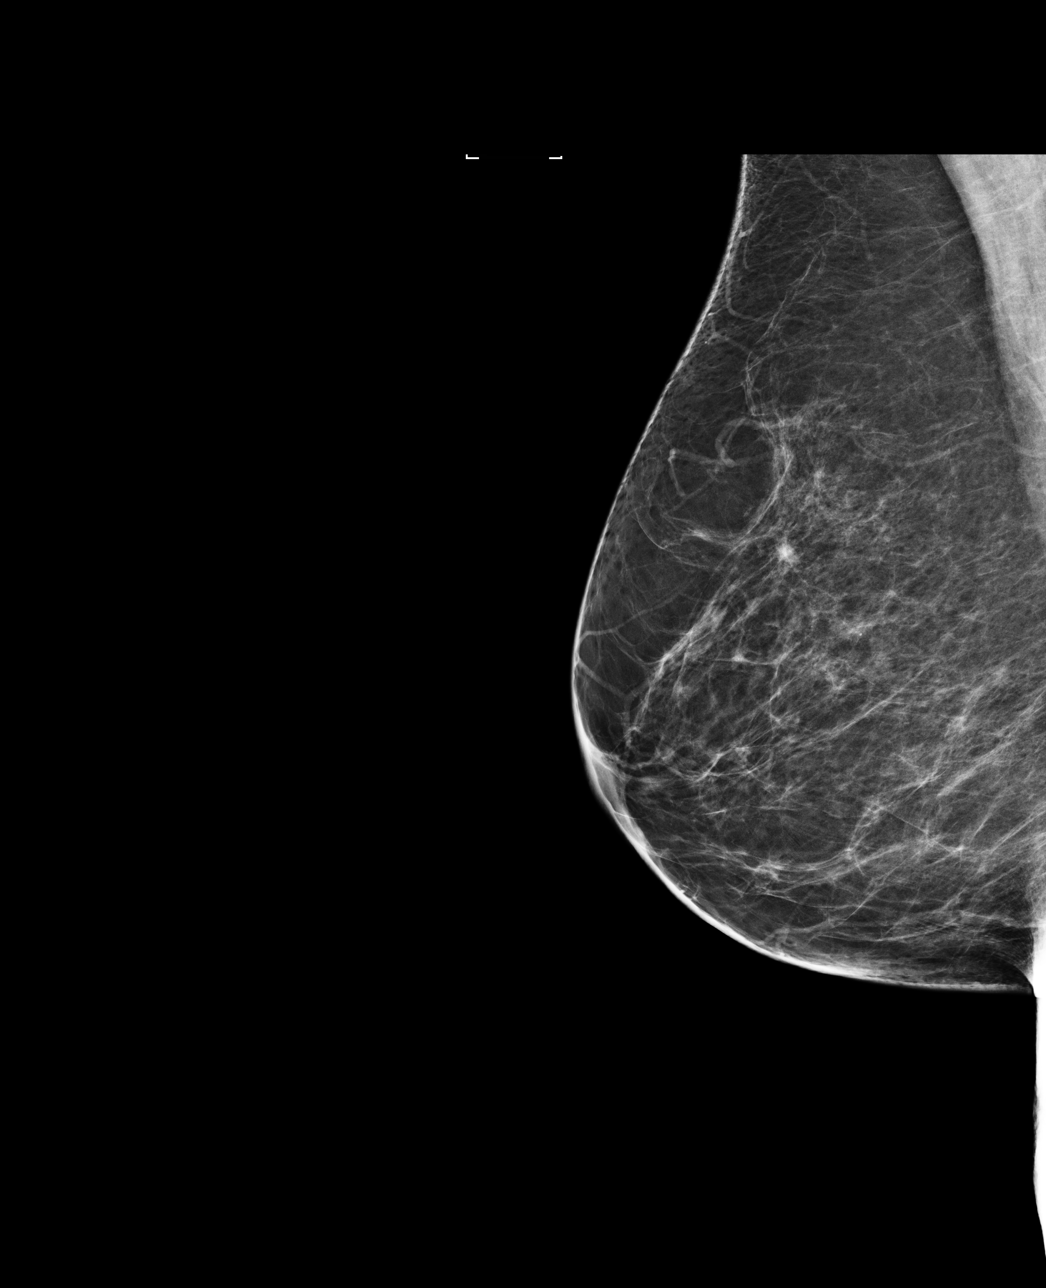

[L CC]
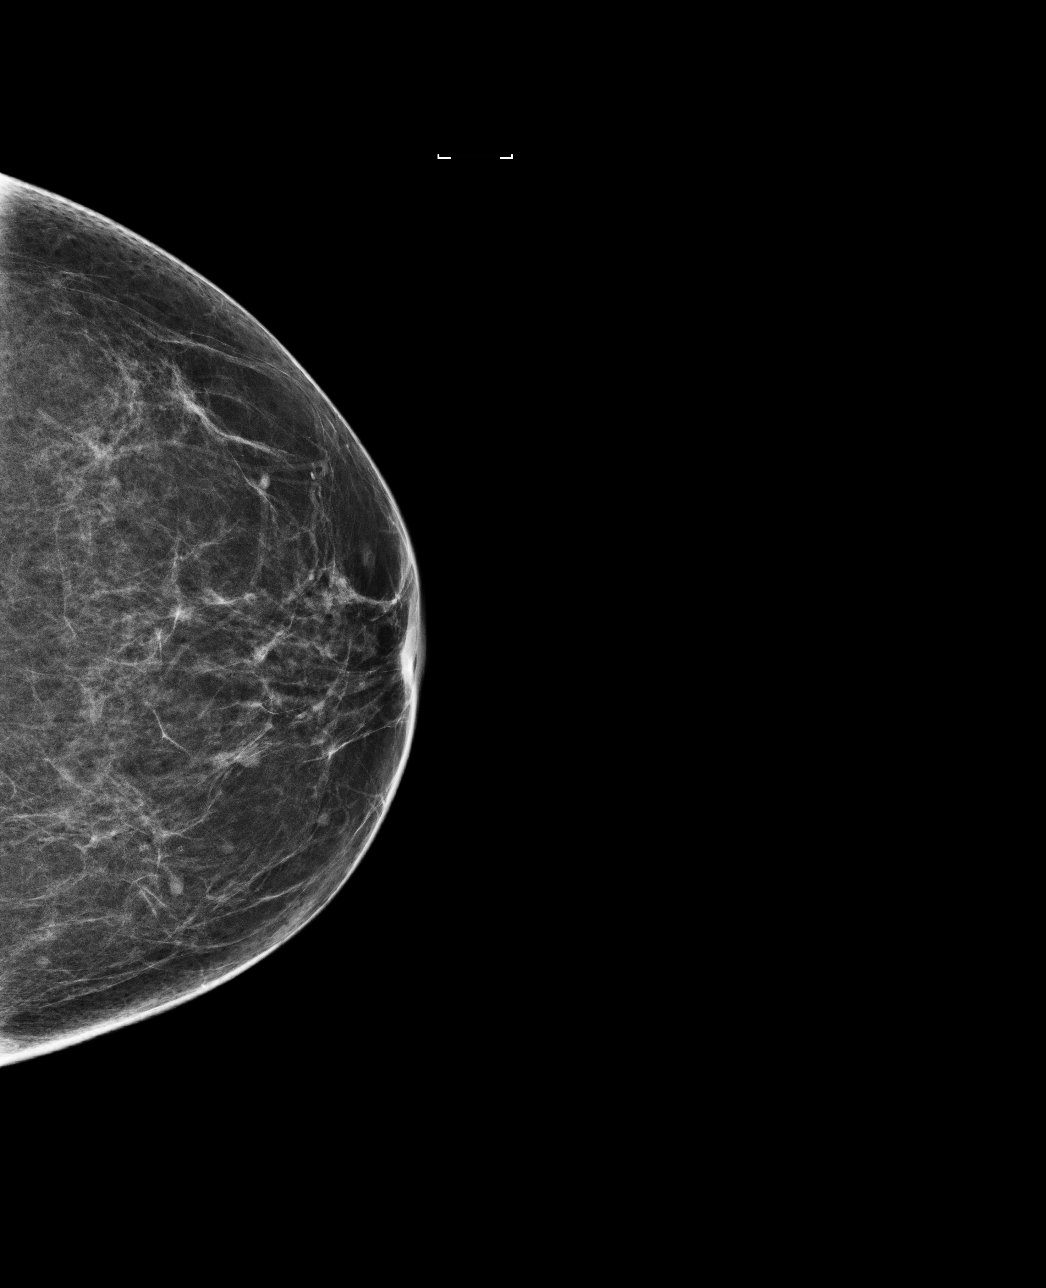

[R CC]
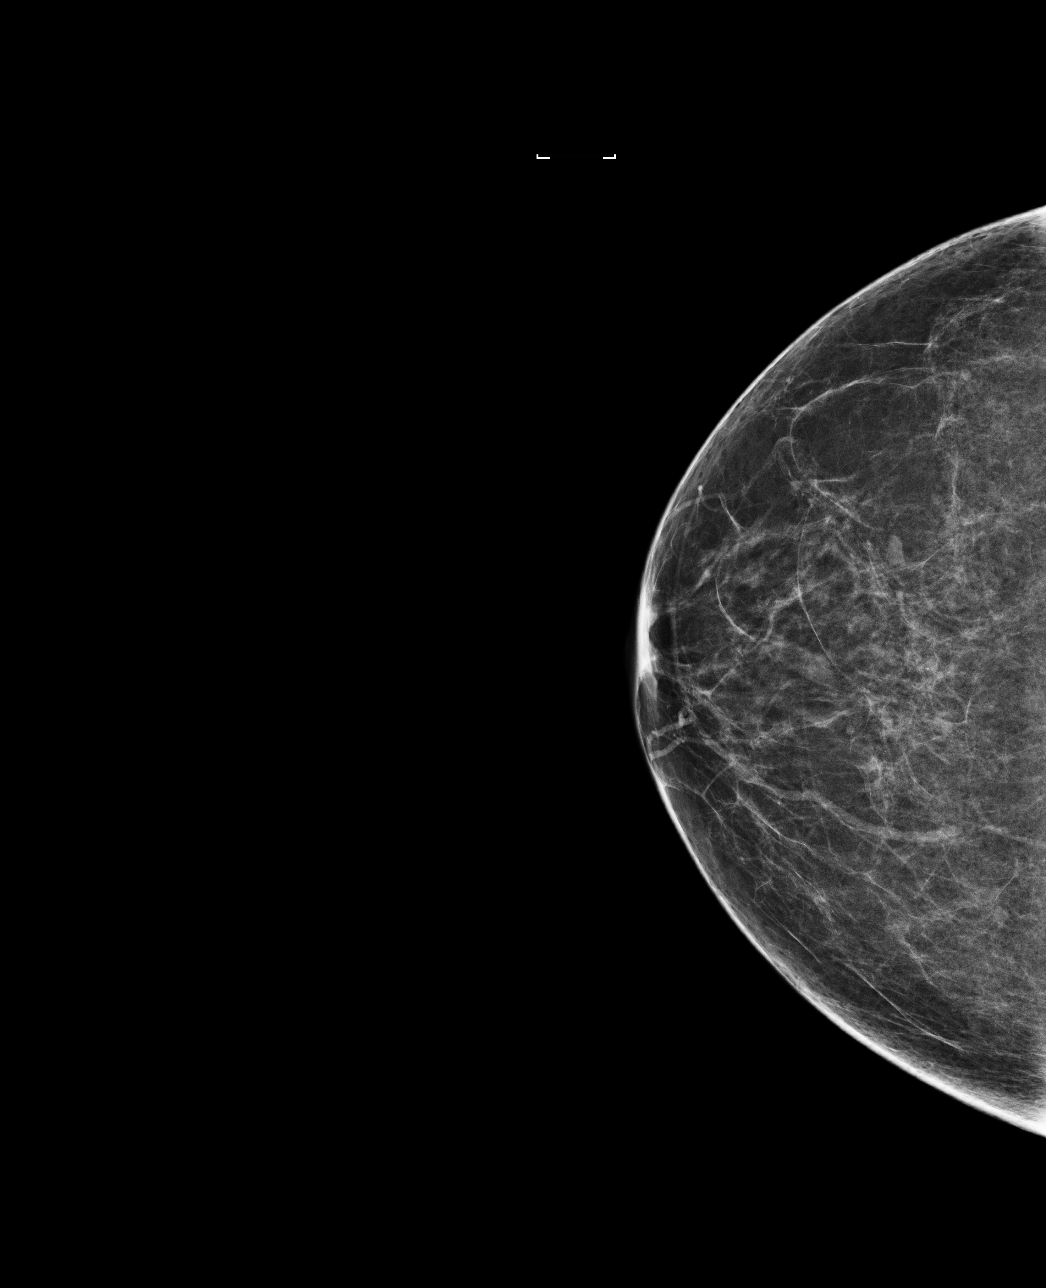

[4 of 4 positions shown; findings below may reference images not displayed]

ACR Breast Density Category b: There are scattered areas of
fibroglandular density.
FINDINGS: There are no findings suspicious for malignancy. Images were
processed with CAD.
IMPRESSION: No mammographic evidence of malignancy. A result letter of this
screening mammogram will be mailed directly to the patient.

RECOMMENDATION:
Screening mammogram in one year. (Code:AS-G-LCT)

BI-RADS CATEGORY  1: Negative.

## 2018-11-15 ENCOUNTER — Other Ambulatory Visit: Payer: Self-pay | Admitting: Physician Assistant

## 2018-11-15 DIAGNOSIS — Z1231 Encounter for screening mammogram for malignant neoplasm of breast: Secondary | ICD-10-CM

## 2018-12-13 NOTE — Progress Notes (Signed)
Patient: Laurie Watson, Female    DOB: 03/03/1951, 68 y.o.   MRN: [id number] Visit Date: 12/15/2018  Today's Provider: Mar Daring, PA-C   Chief Complaint  Patient presents with  . Medicare Wellness   Subjective:     Annual wellness visit Laurie Watson is a 68 y.o. female. She feels well. She reports exercising. She reports she is sleeping well. ----------------------------------------------------------- Eye Exam:02/2019; patient will have records forwarded to [place].  Review of Systems  Constitutional: Negative.   HENT: Negative.   Eyes: Negative.   Respiratory: Negative.   Cardiovascular: Negative.   Gastrointestinal: Negative.   Endocrine: Negative.   Genitourinary: Negative.   Musculoskeletal: Negative.   Skin: Negative.   Allergic/Immunologic: Negative.   Neurological: Negative.   Hematological: Negative.   Psychiatric/Behavioral: Negative.     Social History   Socioeconomic History  . Marital status: Married    Spouse name: Not on file  . Number of children: Not on file  . Years of education: Not on file  . Highest education level: Not on file  Occupational History  . Not on file  Social Needs  . Financial resource strain: Not on file  . Food insecurity    Worry: Not on file    Inability: Not on file  . Transportation needs    Medical: Not on file    Non-medical: Not on file  Tobacco Use  . Smoking status: Never Smoker  . Smokeless tobacco: Never Used  Substance and Sexual Activity  . Alcohol use: Yes    Alcohol/week: 0.0 standard drinks    Comment: occasional - 1x/mo  . Drug use: No  . Sexual activity: Not on file  Lifestyle  . Physical activity    Days per week: Not on file    Minutes per session: Not on file  . Stress: Not on file  Relationships  . Social Herbalist on phone: Not on file    Gets together: Not on file    Attends religious service: Not on file    Active member of club or organization: Not on  file    Attends meetings of clubs or organizations: Not on file    Relationship status: Not on file  . Intimate partner violence    Fear of current or ex partner: Not on file    Emotionally abused: Not on file    Physically abused: Not on file    Forced sexual activity: Not on file  Other Topics Concern  . Not on file  Social History Narrative  . Not on file    Past Medical History:  Diagnosis Date  . Diabetes mellitus without complication ([place])   . Family history of adverse reaction to anesthesia    Daughter admitted to hosp after surgery(age 75). breathing difficulties, cholinesterase deficiency.  Marland Kitchen GERD (gastroesophageal reflux disease)   . History of hiatal hernia   . Hyperlipidemia   . 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 Diabetes mellitus without complication ([place]) [id number]  . Anal fissure 09/05/2014  . Abnormal LFTs 09/05/2014  . Acid reflux 09/05/2014  . Hemorrhoid 09/05/2014  . Hypercholesteremia 09/05/2014    Past Surgical History:  Procedure Laterality Date  . ACHILLES TENDON SURGERY Right 03/12/2018   Procedure: ACHILLES TENDON REPAIR-SECONDARY;  Surgeon: Albertine Nariyah, DPM;  Location:  [place];  Service: Podiatry;  Laterality: Right;  tendon anchors lma with popliteal block  . ANAL FISSURE REPAIR  11/13/2013  . BREAST BIOPSY Right 1990's   Core - neg  . CALCANEAL OSTEOTOMY Right 03/12/2018   Procedure: PARTIAL CALCANECTOMY;  Surgeon: Albertine Ellenore, DPM;  Location: [place];  Service: Podiatry;  Laterality: Right;  Prediabetic  . COLONOSCOPY WITH ESOPHAGOGASTRODUODENOSCOPY (EGD)  05/05/2011  . COLONOSCOPY WITH PROPOFOL N/A 01/18/2018   Procedure: COLONOSCOPY WITH PROPOFOL;  Surgeon: Manya Silvas, MD;  Location: [facility] ENDOSCOPY;  Service: Endoscopy;  Laterality: N/A;  . ELBOW SURGERY Left   . EYE SURGERY  Left 1972  . KNEE SURGERY Left   . TONSILLECTOMY AND ADENOIDECTOMY  1970  . TUBAL LIGATION      Her family history includes Bladder Cancer in her mother; Breast cancer (age of onset: 68) in her mother; CVA in her father; Diabetes in her brother, brother, brother, father, and mother; Heart attack in her brother; Hyperlipidemia in her brother; Hypertension in her father.   Current Outpatient Medications:  .  aspirin 81 MG chewable tablet, Chew 1 tablet by mouth daily., Disp: , Rfl:  .  cholecalciferol (VITAMIN D) 1000 UNITS tablet, Take 1 tablet by mouth daily., Disp: , Rfl:  .  Cinnamon 500 MG capsule, Take 500 mg by mouth daily., Disp: , Rfl:  .  docusate sodium (COLACE) 100 MG capsule, Take 1 capsule by mouth daily., Disp: , Rfl:  .  metFORMIN (GLUCOPHAGE) 500 MG tablet, TAKE 1 TABLET (500 MG TOTAL) BY MOUTH 2 (TWO) TIMES DAILY WITH A MEAL., Disp: 180 tablet, Rfl: 1 .  Omega-3 Fatty Acids (FISH OIL BURP-LESS) 1000 MG CAPS, Take 1 capsule by mouth daily., Disp: , Rfl:  .  omeprazole (PRILOSEC) 20 MG capsule, TAKE 1 CAPSULE BY MOUTH EVERY DAY, Disp: 90 capsule, Rfl: 1 .  Probiotic CAPS, Take 1 capsule by mouth daily., Disp: , Rfl:  .  simvastatin (ZOCOR) 20 MG tablet, TAKE 1 TABLET BY MOUTH EVERY DAY, Disp: 90 tablet, Rfl: 1 .  lidocaine (LINDAMANTLE) 3 % CREA cream, Apply 1 application topically as needed. (Patient not taking: Reported on 12/15/2018), Disp: 85 g, Rfl: 0  Patient Care Team: Mar Daring, PA-C as PCP - General (Family Medicine)    Objective:    Vitals: BP (!) 152/77 (BP Location: Left Arm, Patient Position: Sitting, Cuff Size: Large)   Pulse 67   Temp 97.9 F (36.6 C) (Other (Comment))   Resp 16   Ht 5\' 3"  (1.6 m)   Wt 158 lb 9.6 oz (71.9 kg)   BMI 28.09 kg/m   Physical Exam Vitals signs reviewed.  Constitutional:      General: She is not in acute distress.    Appearance: Normal appearance. She is well-developed and normal weight. She is not ill-appearing  or diaphoretic.  HENT:     Head: Normocephalic and atraumatic.     Right Ear: Tympanic membrane, ear canal and external ear normal.     Left Ear: Tympanic membrane, ear canal and external ear normal.     Nose: Nose normal.     Mouth/Throat:     Mouth: Mucous membranes are moist.     Pharynx: Oropharynx is clear. No oropharyngeal exudate.  Eyes:     General: No scleral icterus.       Right eye: No discharge.        Left eye: No discharge.     Extraocular Movements: Extraocular  movements intact.     Conjunctiva/sclera: Conjunctivae normal.     Pupils: Pupils are equal, round, and reactive to light.  Neck:     Musculoskeletal: Normal range of motion and neck supple.     Thyroid: No thyromegaly.     Vascular: No JVD.     Trachea: No tracheal deviation.  Cardiovascular:     Rate and Rhythm: Normal rate and regular rhythm.     Pulses: Normal pulses.     Heart sounds: Normal heart sounds. No murmur. No friction rub. No gallop.   Pulmonary:     Effort: Pulmonary effort is normal. No respiratory distress.     Breath sounds: Normal breath sounds. No wheezing or rales.  Chest:     Chest wall: No tenderness.  Abdominal:     General: Bowel sounds are normal. There is no distension.     Palpations: Abdomen is soft. There is no mass.     Tenderness: There is no abdominal tenderness. There is no guarding or rebound.  Musculoskeletal: Normal range of motion.        General: No tenderness.     Right lower leg: No edema.     Left lower leg: No edema.  Lymphadenopathy:     Cervical: No cervical adenopathy.  Skin:    General: Skin is warm and dry.     Capillary Refill: Capillary refill takes less than 2 seconds.     Findings: No rash.  Neurological:     General: No focal deficit present.     Mental Status: She is alert and oriented to person, place, and time. Mental status is at baseline.  Psychiatric:        Mood and Affect: Mood normal.        Behavior: Behavior normal.        Thought  Content: Thought content normal.        Judgment: Judgment normal.     Activities of Daily Living In your present state of health, do you have any difficulty performing the following activities: 12/15/2018 03/12/2018  Hearing? N N  Vision? N N  Difficulty concentrating or making decisions? N N  Walking or climbing stairs? Y N  Comment foot surgery 12-20 but "getting better" -  Dressing or bathing? N N  Doing errands, shopping? N -  Some recent data might be hidden    Fall Risk Assessment Fall Risk  12/15/2018 11/11/2017 03/05/2015  Falls in the past year? 0 No No  Number falls in past yr: 0 - -  Injury with Fall? 0 - -     Depression Screen PHQ 2/9 Scores 12/15/2018 11/11/2017 03/05/2015  PHQ - 2 Score 0 0 0  PHQ- 9 Score - 0 -    No flowsheet data found.    Assessment & Plan:     Annual Wellness Visit  Reviewed patient's Family Medical History Reviewed and updated list of patient's medical providers Assessment of cognitive impairment was Watson Assessed patient's functional ability Established a written schedule for health screening [place] Completed and Reviewed  Exercise Activities and Dietary recommendations Goals    . Exercise 150 minutes per week (moderate activity)       Immunization History  Administered Date(s) Administered  . Influenza,inj,Quad PF,6+ Mos 03/05/2015, 03/05/2016  . Influenza-Unspecified 11/11/2017  . Pneumococcal Polysaccharide-23 02/11/2012, 11/11/2017  . Td 04/23/1999  . Tdap 10/17/2010  . Zoster 04/05/2010    Health Maintenance  Topic Date Due  . FOOT EXAM  10/25/1962  . OPHTHALMOLOGY EXAM  02/25/2017  . HEMOGLOBIN A1C  05/14/2018  . INFLUENZA VACCINE  10/23/2018  . URINE MICROALBUMIN  11/12/2018  . PNA vac Low Risk Adult (2 of 2 - PCV13) 11/12/2018  . TETANUS/TDAP  10/16/2020  . MAMMOGRAM  12/13/2020  . COLONOSCOPY  01/19/2028  . DEXA SCAN  Completed  . Hepatitis C Screening  Completed      Discussed health benefits of physical activity, and encouraged her to engage in regular exercise appropriate for her age and condition.    1. Medicare annual wellness visit, subsequent Normal exam. Up to date on screenings and vaccinations.   2. Hypercholesteremia Stable. Continue Simvastatin 20mg . Will check labs as below and f/u pending results. - CBC with Differential/Platelet - Comprehensive metabolic panel - Lipid panel  3. Diabetes mellitus without complication (HCC) Stable. Continue metformin 500mg  BID. Microalbumin stable at 50. Will check labs as below and f/u pending results. - CBC with Differential/Platelet - Comprehensive metabolic panel - Hemoglobin A1c - Lipid panel - glucose blood test strip; To check blood sugar daily  Dispense: 100 each; Refill: 12 - Microlet Lancets MISC; Use once daily to check blood sugar  Dispense: 100 each; Refill: 12 - POCT UA - Microalbumin  4. Abnormal LFTs Diet controlled. Will check labs as below and f/u pending results. - CBC with Differential/Platelet - Comprehensive metabolic panel - Lipid panel  5. Need for pneumococcal vaccination Prevnar 13 Vaccine given to patient without complications. Patient sat for 15 minutes after administration and was tolerated well without adverse effects. - Pneumococcal conjugate vaccine 13-valent IM  6. Need for influenza vaccination Flu vaccine given today without complication. Patient sat upright for 15 minutes to check for adverse reaction before being released. - Flu Vaccine QUAD High Dose(Fluad)    ------------------------------------------------------------------------------------------------------------    Mar Daring, PA-C  [facility]

## 2018-12-14 ENCOUNTER — Ambulatory Visit
Admission: RE | Admit: 2018-12-14 | Discharge: 2018-12-14 | Disposition: A | Discharge status: Home / Self Care | Payer: Medicare Other | Source: Ambulatory Visit | Attending: Physician Assistant | Admitting: Physician Assistant

## 2018-12-14 DIAGNOSIS — Z1231 Encounter for screening mammogram for malignant neoplasm of breast: Secondary | ICD-10-CM | POA: Diagnosis not present

## 2018-12-15 ENCOUNTER — Ambulatory Visit (INDEPENDENT_AMBULATORY_CARE_PROVIDER_SITE_OTHER): Payer: Medicare Other | Admitting: Physician Assistant

## 2018-12-15 ENCOUNTER — Encounter: Payer: Self-pay | Admitting: Physician Assistant

## 2018-12-15 ENCOUNTER — Other Ambulatory Visit: Payer: Self-pay

## 2018-12-15 VITALS — BP 152/77 | HR 67 | Temp 97.9°F | Resp 16 | Ht 63.0 in | Wt 158.6 lb

## 2018-12-15 DIAGNOSIS — R945 Abnormal results of liver function studies: Secondary | ICD-10-CM

## 2018-12-15 DIAGNOSIS — E7800 Pure hypercholesterolemia, unspecified: Secondary | ICD-10-CM | POA: Diagnosis not present

## 2018-12-15 DIAGNOSIS — Z0000 Encounter for general adult medical examination without abnormal findings: Secondary | ICD-10-CM | POA: Diagnosis not present

## 2018-12-15 DIAGNOSIS — R7989 Other specified abnormal findings of blood chemistry: Secondary | ICD-10-CM

## 2018-12-15 DIAGNOSIS — Z23 Encounter for immunization: Secondary | ICD-10-CM | POA: Diagnosis not present

## 2018-12-15 DIAGNOSIS — E119 Type 2 diabetes mellitus without complications: Secondary | ICD-10-CM | POA: Diagnosis not present

## 2018-12-15 LAB — POCT UA - MICROALBUMIN: Microalbumin Ur, POC: 50 mg/L

## 2018-12-15 MED ORDER — MICROLET LANCETS MISC: 12 refills | Status: DC

## 2018-12-15 MED ORDER — GLUCOSE BLOOD VI STRP: ORAL_STRIP | 12 refills | Status: DC

## 2018-12-15 NOTE — Patient Instructions (Signed)

## 2018-12-16 ENCOUNTER — Telehealth: Payer: Self-pay | Admitting: *Deleted

## 2018-12-16 LAB — CBC WITH DIFFERENTIAL/PLATELET
Basophils Absolute: 0.1 10*3/uL (ref 0.0–0.2)
Basos: 1 %
EOS (ABSOLUTE): 0.1 10*3/uL (ref 0.0–0.4)
Eos: 2 %
Hematocrit: 40.3 % (ref 34.0–46.6)
Hemoglobin: 13.4 g/dL (ref 11.1–15.9)
Immature Grans (Abs): 0 10*3/uL (ref 0.0–0.1)
Immature Granulocytes: 0 %
Lymphocytes Absolute: 2.1 10*3/uL (ref 0.7–3.1)
Lymphs: 39 %
MCH: 29.6 pg (ref 26.6–33.0)
MCHC: 33.3 g/dL (ref 31.5–35.7)
MCV: 89 fL (ref 79–97)
Monocytes Absolute: 0.3 10*3/uL (ref 0.1–0.9)
Monocytes: 6 %
Neutrophils Absolute: 2.8 10*3/uL (ref 1.4–7.0)
Neutrophils: 52 %
Platelets: 275 10*3/uL (ref 150–450)
RBC: 4.52 x10E6/uL (ref 3.77–5.28)
RDW: 12.6 % (ref 11.7–15.4)
WBC: 5.4 10*3/uL (ref 3.4–10.8)

## 2018-12-16 LAB — LIPID PANEL
Chol/HDL Ratio: 3.2 ratio (ref 0.0–4.4)
Cholesterol, Total: 161 mg/dL (ref 100–199)
HDL: 51 mg/dL (ref >39)
LDL Chol Calc (NIH): 93 mg/dL (ref 0–99)
Triglycerides: 92 mg/dL (ref 0–149)
VLDL Cholesterol Cal: 17 mg/dL (ref 5–40)

## 2018-12-16 LAB — COMPREHENSIVE METABOLIC PANEL
ALT: 30 IU/L (ref 0–32)
AST: 33 IU/L (ref 0–40)
Albumin/Globulin Ratio: 2.4 — ABNORMAL HIGH (ref 1.2–2.2)
Albumin: 4.8 g/dL (ref 3.8–4.8)
Alkaline Phosphatase: 81 IU/L (ref 39–117)
BUN/Creatinine Ratio: 20 (ref 12–28)
BUN: 13 mg/dL (ref 8–27)
Bilirubin Total: 0.7 mg/dL (ref 0.0–1.2)
CO2: 25 mmol/L (ref 20–29)
Calcium: 9.2 mg/dL (ref 8.7–10.3)
Chloride: 103 mmol/L (ref 96–106)
Creatinine, Ser: 0.64 mg/dL (ref 0.57–1.00)
GFR calc Af Amer: 108 mL/min/{1.73_m2} (ref >59)
GFR calc non Af Amer: 93 mL/min/{1.73_m2} (ref >59)
Globulin, Total: 2 g/dL (ref 1.5–4.5)
Glucose: 119 mg/dL — ABNORMAL HIGH (ref 65–99)
Potassium: 4.2 mmol/L (ref 3.5–5.2)
Sodium: 141 mmol/L (ref 134–144)
Total Protein: 6.8 g/dL (ref 6.0–8.5)

## 2018-12-16 LAB — HEMOGLOBIN A1C
Est. average glucose Bld gHb Est-mCnc: 134 mg/dL
Hgb A1c MFr Bld: 6.3 % — ABNORMAL HIGH (ref 4.8–5.6)

## 2018-12-16 NOTE — Telephone Encounter (Signed)
-----   Message from Mar Daring, [place] sent at 12/16/2018  2:21 PM EDT ----- Blood count is normal. Kidney and liver function are normal. A1c has improved slightly to 6.3 from 6.4. Cholesterol is great and has improved since last year.

## 2018-12-16 NOTE — Telephone Encounter (Signed)
Tried calling pt. No answer and unable to leave vm. Will try again later.

## 2018-12-17 NOTE — Telephone Encounter (Signed)
No answer and no vm. Will try again later.  

## 2018-12-20 NOTE — Telephone Encounter (Signed)
Patient was notified of results. Expressed understanding.  

## 2019-01-18 ENCOUNTER — Telehealth: Payer: Self-pay | Admitting: Physician Assistant

## 2019-01-18 DIAGNOSIS — I10 Essential (primary) hypertension: Secondary | ICD-10-CM

## 2019-01-18 MED ORDER — AMLODIPINE BESYLATE 5 MG PO TABS: 5.0000 mg | ORAL_TABLET | Freq: Every day | ORAL | 3 refills | Status: DC

## 2019-01-18 NOTE — Telephone Encounter (Signed)
Pt's BP has been high.  She is stressed taking care of parents and took a allergy-sinus pill on Sunday.  Her BP was 193/117.  No other symptoms.  Please call pt back at [phone] and advise.  Thanks, [employer]

## 2019-01-18 NOTE — Telephone Encounter (Signed)
Patient reports BP has been elevated since Sunday night. Patient reports a few minutes ago she did recheck and BP 180/97 at 2:30 pm. Patient denies any chest pain, shortness of breath, dizziness, nausea or vomiting. Patient reports a slight headache mostly around her eye. Patient reports its related to sinus.

## 2019-01-18 NOTE — Telephone Encounter (Signed)
lmtcb

## 2019-01-18 NOTE — Telephone Encounter (Signed)
Sent in amlodipine 5mg  to [facility].  Needs to avoid any sinus medication that has pseudoephedrine in it. That will increase BP. May use plain Mucinex or Mucinex DM, or coricidin HBP.  Needs to f/u BP in 2 weeks. Call if not improving or sinus symptoms worsening.

## 2019-03-07 ENCOUNTER — Telehealth: Payer: Self-pay | Admitting: Physician Assistant

## 2019-03-07 DIAGNOSIS — I10 Essential (primary) hypertension: Secondary | ICD-10-CM

## 2019-03-07 MED ORDER — AMLODIPINE BESYLATE 10 MG PO TABS: 10.0000 mg | ORAL_TABLET | Freq: Every day | ORAL | 1 refills | Status: DC

## 2019-03-07 NOTE — Telephone Encounter (Signed)
Increase amlodipine to 10mg . She may take 2 of what she already has on hand. Call if still elevated next week.

## 2019-03-07 NOTE — Telephone Encounter (Signed)
Patient advised as directed below. 

## 2019-03-07 NOTE — Telephone Encounter (Signed)
From [facility] 

## 2019-03-07 NOTE — Telephone Encounter (Signed)
Pt states Anderson Malta put her on blood pressure medication and it is just not working. Pt states her bp this am was 158/87.  However, it has been running 160/95. But top number has been over 150 and the bottom number always over 90. Pt states she has been under a lot of stress, and thinking her bp med could be increased  [id number] [place], [place] - [address] Phone:  [phone]  Fax:  [phone]

## 2019-03-29 ENCOUNTER — Other Ambulatory Visit: Payer: Self-pay | Admitting: Physician Assistant

## 2019-03-29 DIAGNOSIS — K219 Gastro-esophageal reflux disease without esophagitis: Secondary | ICD-10-CM

## 2019-03-29 DIAGNOSIS — E119 Type 2 diabetes mellitus without complications: Secondary | ICD-10-CM

## 2019-03-29 DIAGNOSIS — I10 Essential (primary) hypertension: Secondary | ICD-10-CM

## 2019-03-29 DIAGNOSIS — E7800 Pure hypercholesterolemia, unspecified: Secondary | ICD-10-CM

## 2019-03-29 MED ORDER — OMEPRAZOLE 20 MG PO CPDR: 20.0000 mg | DELAYED_RELEASE_CAPSULE | Freq: Every day | ORAL | 1 refills | Status: DC

## 2019-03-29 MED ORDER — SIMVASTATIN 20 MG PO TABS: 20.0000 mg | ORAL_TABLET | Freq: Every day | ORAL | 1 refills | Status: DC

## 2019-03-29 MED ORDER — AMLODIPINE BESYLATE 10 MG PO TABS: 10.0000 mg | ORAL_TABLET | Freq: Every day | ORAL | 1 refills | Status: DC

## 2019-03-29 MED ORDER — METFORMIN HCL 500 MG PO TABS: 500.0000 mg | ORAL_TABLET | Freq: Two times a day (BID) | ORAL | 1 refills | Status: DC

## 2019-03-29 NOTE — Telephone Encounter (Signed)
Medication refill:  amLODipine (NORVASC) 10 MG tablet [id number]  metFORMIN (GLUCOPHAGE) 500 MG tablet [id number] simvastatin (ZOCOR) 20 MG tablet [id number] omeprazole (PRILOSEC) 20 MG capsule [id number]     Pt called and stated that she will need 90 day refills on all medications. Pt is going to [place] until April because pt husband will be going through cancer treatment.   [id number] [place], [place] - [address] Phone:  [phone]  Fax:  [phone]

## 2019-03-29 NOTE — Telephone Encounter (Signed)
Please review phone messages.  Pt recently started Amlodipine 10mg .  Do you need her to schedule an appointment before refilling it?  Thanks,   -Mickel Baas

## 2019-04-10 ENCOUNTER — Other Ambulatory Visit: Payer: Self-pay | Admitting: Physician Assistant

## 2019-04-10 DIAGNOSIS — I10 Essential (primary) hypertension: Secondary | ICD-10-CM

## 2019-04-11 ENCOUNTER — Ambulatory Visit: Payer: Medicare Other | Attending: Internal Medicine

## 2019-04-11 DIAGNOSIS — Z20822 Contact with and (suspected) exposure to covid-19: Secondary | ICD-10-CM | POA: Diagnosis not present

## 2019-04-13 LAB — NOVEL CORONAVIRUS, NAA: SARS-CoV-2, NAA: NOT DETECTED

## 2019-08-11 DIAGNOSIS — Z85828 Personal history of other malignant neoplasm of skin: Secondary | ICD-10-CM | POA: Diagnosis not present

## 2019-08-11 DIAGNOSIS — L570 Actinic keratosis: Secondary | ICD-10-CM | POA: Diagnosis not present

## 2019-08-11 DIAGNOSIS — D2262 Melanocytic nevi of left upper limb, including shoulder: Secondary | ICD-10-CM | POA: Diagnosis not present

## 2019-08-11 DIAGNOSIS — X32XXXA Exposure to sunlight, initial encounter: Secondary | ICD-10-CM | POA: Diagnosis not present

## 2019-08-11 DIAGNOSIS — D225 Melanocytic nevi of trunk: Secondary | ICD-10-CM | POA: Diagnosis not present

## 2019-08-11 DIAGNOSIS — D2261 Melanocytic nevi of right upper limb, including shoulder: Secondary | ICD-10-CM | POA: Diagnosis not present

## 2019-08-11 DIAGNOSIS — D485 Neoplasm of uncertain behavior of skin: Secondary | ICD-10-CM | POA: Diagnosis not present

## 2019-08-11 DIAGNOSIS — D2271 Melanocytic nevi of right lower limb, including hip: Secondary | ICD-10-CM | POA: Diagnosis not present

## 2019-08-25 DIAGNOSIS — L570 Actinic keratosis: Secondary | ICD-10-CM | POA: Diagnosis not present

## 2019-09-23 ENCOUNTER — Other Ambulatory Visit: Payer: Self-pay | Admitting: Physician Assistant

## 2019-09-23 DIAGNOSIS — E7800 Pure hypercholesterolemia, unspecified: Secondary | ICD-10-CM

## 2019-09-23 DIAGNOSIS — K219 Gastro-esophageal reflux disease without esophagitis: Secondary | ICD-10-CM

## 2019-09-23 DIAGNOSIS — I10 Essential (primary) hypertension: Secondary | ICD-10-CM

## 2019-09-23 NOTE — Telephone Encounter (Signed)
Requested  medications are  due for refill today yes  Requested medications are on the active medication list yes  Last refill 4/4  Last  visit 11/2018  Future visit scheduled no  Notes to clinic Failed protocol due to no visit within 6 months

## 2019-09-24 ENCOUNTER — Other Ambulatory Visit: Payer: Self-pay | Admitting: Physician Assistant

## 2019-09-24 DIAGNOSIS — E119 Type 2 diabetes mellitus without complications: Secondary | ICD-10-CM

## 2019-09-24 NOTE — Telephone Encounter (Signed)
Requested medication (s) are due for refill today: yes  Requested medication (s) are on the active medication list: yes  Last refill:  03/29/19  Future visit scheduled: no  Notes to clinic:  Called pt and LM on VM to call office to make med RF appt   Requested Prescriptions  Pending Prescriptions Disp Refills   metFORMIN (GLUCOPHAGE) 500 MG tablet [Pharmacy Med Name: METFORMIN HCL 500 MG TABLET] 180 tablet 1    Sig: Take 1 tablet (500 mg total) by mouth 2 (two) times daily with a meal.      Endocrinology:  Diabetes - Biguanides Failed - 09/24/2019  9:49 AM      Failed - HBA1C is between 0 and 7.9 and within 180 days    Hgb A1c MFr Bld  Date Value Ref Range Status  12/15/2018 6.3 (H) 4.8 - 5.6 % Final    Comment:             Prediabetes: 5.7 - 6.4          Diabetes: >6.4          Glycemic control for adults with diabetes: <7.0           Failed - Valid encounter within last 6 months    Recent Outpatient Visits           1 year ago Initial Medicare annual wellness visit   [facility] [place], Clearnce Sorrel, [place]   3 years ago Annual physical exam   [place], Clearnce Sorrel, [place]   4 years ago Annual physical exam   [facility] Margarita Rana, [place]   4 years ago Breast pain, right   [facility] Margarita Rana, [place]   5 years ago Left otitis media with effusion   [place], Vickki Muff, [place]              Passed - Cr in normal range and within 360 days    Creatinine, Ser  Date Value Ref Range Status  12/15/2018 0.64 0.57 - 1.00 mg/dL Final          Passed - eGFR in normal range and within 360 days    GFR calc Af Amer  Date Value Ref Range Status  12/15/2018 108 >59 mL/min/1.73 Final   GFR calc non Af Amer  Date Value Ref Range Status  12/15/2018 93 >59 mL/min/1.73 Final

## 2019-10-07 DIAGNOSIS — H21532 Iridodialysis, left eye: Secondary | ICD-10-CM | POA: Diagnosis not present

## 2019-10-07 DIAGNOSIS — H26102 Unspecified traumatic cataract, left eye: Secondary | ICD-10-CM | POA: Diagnosis not present

## 2019-10-07 DIAGNOSIS — E113293 Type 2 diabetes mellitus with mild nonproliferative diabetic retinopathy without macular edema, bilateral: Secondary | ICD-10-CM | POA: Diagnosis not present

## 2019-10-07 DIAGNOSIS — H52211 Irregular astigmatism, right eye: Secondary | ICD-10-CM | POA: Diagnosis not present

## 2019-10-07 DIAGNOSIS — H524 Presbyopia: Secondary | ICD-10-CM | POA: Diagnosis not present

## 2019-10-07 LAB — HM DIABETES EYE EXAM

## 2019-10-13 ENCOUNTER — Encounter: Payer: Self-pay | Admitting: Physician Assistant

## 2019-10-13 DIAGNOSIS — E11319 Type 2 diabetes mellitus with unspecified diabetic retinopathy without macular edema: Secondary | ICD-10-CM | POA: Insufficient documentation

## 2019-10-24 ENCOUNTER — Other Ambulatory Visit: Payer: Self-pay | Admitting: Physician Assistant

## 2019-10-24 DIAGNOSIS — E119 Type 2 diabetes mellitus without complications: Secondary | ICD-10-CM

## 2019-10-24 NOTE — Telephone Encounter (Signed)
Requested medication (s) are due for refill today: no  Requested medication (s) are on the active medication list: yes  Last refill: 09/27/2019  Future visit scheduled:no  Notes to clinic: patient has not scheduled follow up    Requested Prescriptions  Pending Prescriptions Disp Refills   metFORMIN (GLUCOPHAGE) 500 MG tablet [Pharmacy Med Name: METFORMIN HCL 500 MG TABLET] 60 tablet 0    Sig: TAKE 1 TABLET BY MOUTH 2 TIMES DAILY WITH A MEAL. OFFICE VISIT NEEDED FOR MORE REFILLS.      Endocrinology:  Diabetes - Biguanides Failed - 10/24/2019  9:30 AM      Failed - HBA1C is between 0 and 7.9 and within 180 days    Hgb A1c MFr Bld  Date Value Ref Range Status  12/15/2018 6.3 (H) 4.8 - 5.6 % Final    Comment:             Prediabetes: 5.7 - 6.4          Diabetes: >6.4          Glycemic control for adults with diabetes: <7.0           Failed - Valid encounter within last 6 months    Recent Outpatient Visits           1 year ago Initial Medicare annual wellness visit   [place], Clearnce Sorrel, [place]   3 years ago Annual physical exam   [place], Clearnce Sorrel, [place]   4 years ago Annual physical exam   [facility] Margarita Rana, [place]   4 years ago Breast pain, right   [facility] Margarita Rana, [place]   5 years ago Left otitis media with effusion   [place], Vickki Muff, [place]              Passed - Cr in normal range and within 360 days    Creatinine, Ser  Date Value Ref Range Status  12/15/2018 0.64 0.57 - 1.00 mg/dL Final          Passed - eGFR in normal range and within 360 days    GFR calc Af Amer  Date Value Ref Range Status  12/15/2018 108 >59 mL/min/1.73 Final   GFR calc non Af Amer  Date Value Ref Range Status  12/15/2018 93 >59 mL/min/1.73 Final

## 2019-10-26 ENCOUNTER — Other Ambulatory Visit: Payer: Self-pay | Admitting: Physician Assistant

## 2019-10-26 DIAGNOSIS — Z1231 Encounter for screening mammogram for malignant neoplasm of breast: Secondary | ICD-10-CM

## 2019-11-16 ENCOUNTER — Other Ambulatory Visit: Payer: Self-pay | Admitting: Physician Assistant

## 2019-11-16 DIAGNOSIS — E119 Type 2 diabetes mellitus without complications: Secondary | ICD-10-CM

## 2019-11-16 NOTE — Telephone Encounter (Signed)
appt scheduled for 12/26/19

## 2019-12-11 ENCOUNTER — Other Ambulatory Visit: Payer: Self-pay | Admitting: Physician Assistant

## 2019-12-11 DIAGNOSIS — E119 Type 2 diabetes mellitus without complications: Secondary | ICD-10-CM

## 2019-12-11 NOTE — Telephone Encounter (Signed)
Requested Prescriptions  Pending Prescriptions Disp Refills  . metFORMIN (GLUCOPHAGE) 500 MG tablet [Pharmacy Med Name: METFORMIN HCL 500 MG TABLET] 30 tablet 0    Sig: TAKE 1 TABLET BY MOUTH 2 TIMES DAILY WITH A MEAL. OFFICE VISIT NEEDED FOR MORE REFILLS.     Endocrinology:  Diabetes - Biguanides Failed - 12/11/2019 11:33 AM      Failed - Cr in normal range and within 360 days    Creatinine, Ser  Date Value Ref Range Status  12/15/2018 0.64 0.57 - 1.00 mg/dL Final         Failed - HBA1C is between 0 and 7.9 and within 180 days    Hgb A1c MFr Bld  Date Value Ref Range Status  12/15/2018 6.3 (H) 4.8 - 5.6 % Final    Comment:             Prediabetes: 5.7 - 6.4          Diabetes: >6.4          Glycemic control for adults with diabetes: <7.0          Failed - eGFR in normal range and within 360 days    GFR calc Af Amer  Date Value Ref Range Status  12/15/2018 108 >59 mL/min/1.73 Final   GFR calc non Af Amer  Date Value Ref Range Status  12/15/2018 93 >59 mL/min/1.73 Final         Failed - Valid encounter within last 6 months    Recent Outpatient Visits          2 years ago Initial Medicare annual wellness visit   [place], Clearnce Sorrel, [place]   3 years ago Annual physical exam   [facility] Mar Daring, [place]   4 years ago Annual physical exam   [facility] Margarita Rana, [place]   5 years ago Breast pain, right   [place], [place]   5 years ago Left otitis media with effusion   [place], Vickki Muff, [place]

## 2019-12-15 ENCOUNTER — Other Ambulatory Visit: Payer: Self-pay | Admitting: Physician Assistant

## 2019-12-15 DIAGNOSIS — E7800 Pure hypercholesterolemia, unspecified: Secondary | ICD-10-CM

## 2019-12-15 DIAGNOSIS — K219 Gastro-esophageal reflux disease without esophagitis: Secondary | ICD-10-CM

## 2019-12-15 DIAGNOSIS — I10 Essential (primary) hypertension: Secondary | ICD-10-CM

## 2019-12-21 DIAGNOSIS — Z1152 Encounter for screening for COVID-19: Secondary | ICD-10-CM | POA: Diagnosis not present

## 2019-12-21 DIAGNOSIS — Z03818 Encounter for observation for suspected exposure to other biological agents ruled out: Secondary | ICD-10-CM | POA: Diagnosis not present

## 2019-12-22 ENCOUNTER — Other Ambulatory Visit: Payer: Self-pay | Admitting: Physician Assistant

## 2019-12-22 ENCOUNTER — Ambulatory Visit
Admission: RE | Admit: 2019-12-22 | Discharge: 2019-12-22 | Disposition: A | Discharge status: Home / Self Care | Payer: Medicare Other | Attending: Physician Assistant | Admitting: Physician Assistant

## 2019-12-22 ENCOUNTER — Ambulatory Visit
Admission: RE | Admit: 2019-12-22 | Discharge: 2019-12-22 | Disposition: A | Discharge status: Home / Self Care | Payer: Medicare Other | Source: Ambulatory Visit | Attending: Physician Assistant | Admitting: Physician Assistant

## 2019-12-22 ENCOUNTER — Other Ambulatory Visit: Payer: Self-pay

## 2019-12-22 DIAGNOSIS — E119 Type 2 diabetes mellitus without complications: Secondary | ICD-10-CM

## 2019-12-22 DIAGNOSIS — R059 Cough, unspecified: Secondary | ICD-10-CM

## 2019-12-22 DIAGNOSIS — R05 Cough: Secondary | ICD-10-CM | POA: Diagnosis not present

## 2019-12-26 ENCOUNTER — Encounter: Payer: Self-pay | Admitting: Physician Assistant

## 2019-12-26 ENCOUNTER — Ambulatory Visit (INDEPENDENT_AMBULATORY_CARE_PROVIDER_SITE_OTHER): Payer: Medicare Other | Admitting: Physician Assistant

## 2019-12-26 ENCOUNTER — Telehealth: Payer: Self-pay

## 2019-12-26 ENCOUNTER — Other Ambulatory Visit: Payer: Self-pay

## 2019-12-26 VITALS — BP 134/64 | HR 64 | Temp 98.5°F | Resp 16 | Ht 63.0 in | Wt 154.4 lb

## 2019-12-26 DIAGNOSIS — Z1239 Encounter for other screening for malignant neoplasm of breast: Secondary | ICD-10-CM | POA: Diagnosis not present

## 2019-12-26 DIAGNOSIS — Z6827 Body mass index (BMI) 27.0-27.9, adult: Secondary | ICD-10-CM

## 2019-12-26 DIAGNOSIS — Z8052 Family history of malignant neoplasm of bladder: Secondary | ICD-10-CM | POA: Diagnosis not present

## 2019-12-26 DIAGNOSIS — K219 Gastro-esophageal reflux disease without esophagitis: Secondary | ICD-10-CM | POA: Diagnosis not present

## 2019-12-26 DIAGNOSIS — E113293 Type 2 diabetes mellitus with mild nonproliferative diabetic retinopathy without macular edema, bilateral: Secondary | ICD-10-CM | POA: Diagnosis not present

## 2019-12-26 DIAGNOSIS — Z0000 Encounter for general adult medical examination without abnormal findings: Secondary | ICD-10-CM

## 2019-12-26 DIAGNOSIS — Z23 Encounter for immunization: Secondary | ICD-10-CM | POA: Diagnosis not present

## 2019-12-26 DIAGNOSIS — E7800 Pure hypercholesterolemia, unspecified: Secondary | ICD-10-CM

## 2019-12-26 DIAGNOSIS — I10 Essential (primary) hypertension: Secondary | ICD-10-CM | POA: Diagnosis not present

## 2019-12-26 DIAGNOSIS — E11319 Type 2 diabetes mellitus with unspecified diabetic retinopathy without macular edema: Secondary | ICD-10-CM

## 2019-12-26 LAB — POCT URINALYSIS DIPSTICK
Bilirubin, UA: NEGATIVE
Glucose, UA: NEGATIVE
Ketones, UA: NEGATIVE
Leukocytes, UA: NEGATIVE
Nitrite, UA: NEGATIVE
Protein, UA: NEGATIVE
Spec Grav, UA: 1.02 (ref 1.010–1.025)
Urobilinogen, UA: 0.2 E.U./dL
pH, UA: 6 (ref 5.0–8.0)

## 2019-12-26 LAB — POCT UA - MICROALBUMIN: Microalbumin Ur, POC: 20 mg/L

## 2019-12-26 MED ORDER — MICROLET LANCETS MISC: 12 refills | Status: DC

## 2019-12-26 MED ORDER — SIMVASTATIN 20 MG PO TABS: 20.0000 mg | ORAL_TABLET | Freq: Every day | ORAL | 3 refills | Status: DC

## 2019-12-26 MED ORDER — GLUCOSE BLOOD VI STRP: ORAL_STRIP | 12 refills | Status: DC

## 2019-12-26 MED ORDER — OMEPRAZOLE 20 MG PO CPDR: 20.0000 mg | DELAYED_RELEASE_CAPSULE | Freq: Every day | ORAL | 3 refills | Status: DC

## 2019-12-26 MED ORDER — METFORMIN HCL 500 MG PO TABS: 500.0000 mg | ORAL_TABLET | Freq: Two times a day (BID) | ORAL | 3 refills | Status: DC

## 2019-12-26 MED ORDER — AMLODIPINE BESYLATE 10 MG PO TABS: 10.0000 mg | ORAL_TABLET | Freq: Every day | ORAL | 3 refills | Status: DC

## 2019-12-26 NOTE — Patient Instructions (Signed)
Health Maintenance After Age 67 After age 67, you are at a higher risk for certain long-term diseases and infections as well as injuries from falls. Falls are a major cause of broken bones and head injuries in people who are older than age 67. Getting regular preventive care can help to keep you healthy and well. Preventive care includes getting regular testing and making lifestyle changes as recommended by your health care provider. Talk with your health care provider about:  Which screenings and tests you should have. A screening is a test that checks for a disease when you have no symptoms.  A diet and exercise plan that is right for you. What should I know about screenings and tests to prevent falls? Screening and testing are the best ways to find a health problem early. Early diagnosis and treatment give you the best chance of managing medical conditions that are common after age 67. Certain conditions and lifestyle choices may make you more likely to have a fall. Your health care provider may recommend:  Regular vision checks. Poor vision and conditions such as cataracts can make you more likely to have a fall. If you wear glasses, make sure to get your prescription updated if your vision changes.  Medicine review. Work with your health care provider to regularly review all of the medicines you are taking, including over-the-counter medicines. Ask your health care provider about any side effects that may make you more likely to have a fall. Tell your health care provider if any medicines that you take make you feel dizzy or sleepy.  Osteoporosis screening. Osteoporosis is a condition that causes the bones to get weaker. This can make the bones weak and cause them to break more easily.  Blood pressure screening. Blood pressure changes and medicines to control blood pressure can make you feel dizzy.  Strength and balance checks. Your health care provider may recommend certain tests to check your  strength and balance while standing, walking, or changing positions.  Foot health exam. Foot pain and numbness, as well as not wearing proper footwear, can make you more likely to have a fall.  Depression screening. You may be more likely to have a fall if you have a fear of falling, feel emotionally low, or feel unable to do activities that you used to do.  Alcohol use screening. Using too much alcohol can affect your balance and may make you more likely to have a fall. What actions can I take to lower my risk of falls? General instructions  Talk with your health care provider about your risks for falling. Tell your health care provider if: ? You fall. Be sure to tell your health care provider about all falls, even ones that seem minor. ? You feel dizzy, sleepy, or off-balance.  Take over-the-counter and prescription medicines only as told by your health care provider. These include any supplements.  Eat a healthy diet and maintain a healthy weight. A healthy diet includes low-fat dairy products, low-fat (lean) meats, and fiber from whole grains, beans, and lots of fruits and vegetables. Home safety  Remove any tripping hazards, such as rugs, cords, and clutter.  Install safety equipment such as grab bars in bathrooms and safety rails on stairs.  Keep rooms and walkways well-lit. Activity   Follow a regular exercise program to stay fit. This will help you maintain your balance. Ask your health care provider what types of exercise are appropriate for you.  If you need a cane or   walker, use it as recommended by your health care provider.  Wear supportive shoes that have nonskid soles. Lifestyle  Do not drink alcohol if your health care provider tells you not to drink.  If you drink alcohol, limit how much you have: ? 0-1 drink a day for women. ? 0-2 drinks a day for men.  Be aware of how much alcohol is in your drink. In the [place] one drink equals one typical bottle of beer (12  oz), one-half glass of wine (5 oz), or one shot of hard liquor (1 oz).  Do not use any products that contain nicotine or tobacco, such as cigarettes and e-cigarettes. If you need help quitting, ask your health care provider. Summary  Having a healthy lifestyle and getting preventive care can help to protect your health and wellness after age 67.  Screening and testing are the best way to find a health problem early and help you avoid having a fall. Early diagnosis and treatment give you the best chance for managing medical conditions that are more common for people who are older than age 67.  Falls are a major cause of broken bones and head injuries in people who are older than age 67. Take precautions to prevent a fall at home.  Work with your health care provider to learn what changes you can make to improve your health and wellness and to prevent falls. This information is not intended to replace advice given to you by your health care provider. Make sure you discuss any questions you have with your health care provider. Document Revised: 07/01/2018 Document Reviewed: 01/21/2017 Elsevier Patient Education  2020 Elsevier Inc.  

## 2019-12-26 NOTE — Telephone Encounter (Signed)
-----   Message from Mar Daring, [place] sent at 12/26/2019 12:22 PM EDT ----- Urinalysis and microalbumin are both normal.

## 2019-12-26 NOTE — Telephone Encounter (Signed)
Urinalysis and microalbumin are both normal.  Written by Mar Daring, PA-C on 12/26/2019 12:22 PM EDT Seen by patient Laurie Watson on 12/26/2019 12:44 PM

## 2019-12-26 NOTE — Progress Notes (Signed)
Annual Wellness Visit     Patient: Laurie Watson, Female    DOB: 1950-04-07, 69 y.o.   MRN: [id number] Visit Date: 12/26/2019  Today's Provider: Mar Daring, PA-C   Chief Complaint  Patient presents with  . Medicare Wellness   Subjective    SHAWNTAVIA Watson is a 69 y.o. female who presents today for her Annual Wellness Visit. She reports consuming a healthy  diet. Home exercise routine includes keeps very active. She generally feels well. She reports sleeping fairly well. She does not have additional problems to discuss today.   HPI  Patient Active Problem List   Diagnosis Date Noted  . Diabetic retinopathy associated with controlled type 2 diabetes mellitus ([place]) 10/13/2019  . T2DM (type 2 diabetes mellitus) ([place]) 03/05/2015  . Anal fissure 09/05/2014  . Abnormal LFTs 09/05/2014  . Acid reflux 09/05/2014  . Hemorrhoid 09/05/2014  . Hypercholesteremia 09/05/2014   Past Medical History:  Diagnosis Date  . Diabetes mellitus without complication ([place])   . Family history of adverse reaction to anesthesia    Daughter admitted to hosp after surgery(age 7). breathing difficulties, cholinesterase deficiency.  Marland Kitchen GERD (gastroesophageal reflux disease)   . History of hiatal hernia   . Hyperlipidemia   . 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 aspirin 81 MG chewable tablet Chew 1 tablet by mouth daily.  . cholecalciferol (VITAMIN D) 1000 UNITS tablet Take 1 tablet by mouth daily.  . Cinnamon 500 MG capsule Take 500 mg by mouth daily.  Marland Kitchen docusate sodium (COLACE) 100 MG capsule Take 1 capsule by mouth daily.  . Omega-3 Fatty Acids (FISH OIL BURP-LESS) 1000 MG CAPS Take 1 capsule by mouth daily.  . Probiotic CAPS Take 1 capsule by mouth daily.  . [DISCONTINUED] amLODipine (NORVASC) 10 MG tablet TAKE 1 TABLET BY MOUTH EVERY DAY  .  [DISCONTINUED] glucose blood test strip To check blood sugar daily  . [DISCONTINUED] metFORMIN (GLUCOPHAGE) 500 MG tablet TAKE 1 TABLET BY MOUTH 2 TIMES DAILY WITH A MEAL. OFFICE VISIT NEEDED FOR MORE REFILLS.  . [DISCONTINUED] Microlet Lancets MISC Use once daily to check blood sugar  . [DISCONTINUED] omeprazole (PRILOSEC) 20 MG capsule TAKE 1 CAPSULE BY MOUTH EVERY DAY  . [DISCONTINUED] simvastatin (ZOCOR) 20 MG tablet TAKE 1 TABLET BY MOUTH EVERY DAY   No facility-administered medications prior to visit.    Allergies  Allergen Reactions  . Codeine Nausea Only  . Penicillins Rash    Patient Care Team: Rubye Beach as PCP - General (Family Medicine)  Review of Systems  Constitutional: Negative.   HENT: Positive for congestion ("some") and sinus pressure.   Eyes: Negative.   Respiratory: Positive for cough.   Cardiovascular: Negative.   Gastrointestinal: Negative.   Endocrine: Negative.   Genitourinary: Negative.   Musculoskeletal: Negative.   Skin: Negative.   Allergic/Immunologic: Negative.   Neurological: Negative.   Hematological: Negative.   Psychiatric/Behavioral: Negative.     Last CBC Lab Results  Component Value Date   WBC 6.2 12/26/2019   HGB 12.9 12/26/2019   HCT 39.1 12/26/2019   MCV 93 12/26/2019   MCH 30.6 12/26/2019   RDW 12.3 12/26/2019   PLT 307 17/61/6073   Last metabolic panel Lab Results  Component Value Date   GLUCOSE 119 (H) 12/26/2019   NA  142 12/26/2019   K 4.4 12/26/2019   CL 102 12/26/2019   CO2 24 12/26/2019   BUN 14 12/26/2019   CREATININE 0.66 12/26/2019   GFRNONAA 92 12/26/2019   GFRAA 106 12/26/2019   CALCIUM 9.5 12/26/2019   PROT 6.9 12/26/2019   ALBUMIN 4.7 12/26/2019   LABGLOB 2.2 12/26/2019   AGRATIO 2.1 12/26/2019   BILITOT 0.6 12/26/2019   ALKPHOS 92 12/26/2019   AST 30 12/26/2019   ALT 30 12/26/2019      Objective    Vitals: BP 134/64 (BP Location: Right Arm, Patient Position: Sitting, Cuff Size:  Large)   Pulse 64   Temp 98.5 F (36.9 C) (Oral)   Resp 16   Ht 5\' 3"  (1.6 m)   Wt 154 lb 6.4 oz (70 kg)   BMI 27.35 kg/m  BP Readings from Last 3 Encounters:  12/26/19 134/64  12/15/18 (!) 152/77  03/12/18 (!) 150/80   Wt Readings from Last 3 Encounters:  12/26/19 154 lb 6.4 oz (70 kg)  12/15/18 158 lb 9.6 oz (71.9 kg)  03/12/18 165 lb (74.8 kg)      Physical Exam Vitals reviewed.  Constitutional:      General: She is not in acute distress.    Appearance: Normal appearance. She is well-developed, well-groomed and overweight. She is not ill-appearing or diaphoretic.  HENT:     Head: Normocephalic and atraumatic.     Right Ear: Tympanic membrane, ear canal and external ear normal.     Left Ear: Tympanic membrane, ear canal and external ear normal.     Nose: Nose normal.     Mouth/Throat:     Mouth: Mucous membranes are moist.     Pharynx: Oropharynx is clear. No oropharyngeal exudate.  Eyes:     General: No scleral icterus.       Right eye: No discharge.        Left eye: No discharge.     Extraocular Movements: Extraocular movements intact.     Conjunctiva/sclera: Conjunctivae normal.     Pupils: Pupils are equal, round, and reactive to light.  Neck:     Thyroid: No thyromegaly.     Vascular: No carotid bruit or JVD.     Trachea: No tracheal deviation.  Cardiovascular:     Rate and Rhythm: Normal rate and regular rhythm.     Pulses: Normal pulses.     Heart sounds: Normal heart sounds. No murmur heard.  No friction rub. No gallop.   Pulmonary:     Effort: Pulmonary effort is normal. No respiratory distress.     Breath sounds: Normal breath sounds. No wheezing or rales.  Chest:     Chest wall: No tenderness.  Abdominal:     General: Abdomen is flat. Bowel sounds are normal. There is no distension.     Palpations: Abdomen is soft. There is no mass.     Tenderness: There is no abdominal tenderness. There is no guarding or rebound.  Musculoskeletal:         General: No tenderness. Normal range of motion.     Cervical back: Normal range of motion and neck supple. No tenderness.     Right lower leg: No edema.     Left lower leg: No edema.  Lymphadenopathy:     Cervical: No cervical adenopathy.  Skin:    General: Skin is warm and dry.     Capillary Refill: Capillary refill takes less than 2 seconds.     Findings:  No rash.  Neurological:     General: No focal deficit present.     Mental Status: She is alert and oriented to person, place, and time. Mental status is at baseline.  Psychiatric:        Mood and Affect: Mood normal.        Behavior: Behavior normal. Behavior is cooperative.        Thought Content: Thought content normal.        Judgment: Judgment normal.     Most recent functional status assessment: In your present state of health, do you have any difficulty performing the following activities: 12/26/2019  Hearing? N  Vision? N  Difficulty concentrating or making decisions? N  Walking or climbing stairs? N  Dressing or bathing? N  Doing errands, shopping? N  Some recent data might be hidden   Most recent fall risk assessment: Fall Risk  12/15/2018  Falls in the past year? 0  Number falls in past yr: 0  Injury with Fall? 0    Most recent depression screenings: PHQ 2/9 Scores 12/15/2018 11/11/2017  PHQ - 2 Score 0 0  PHQ- 9 Score - 0   Most recent cognitive screening: No flowsheet data found. Most recent Audit-C alcohol use screening Alcohol Use Disorder Test (AUDIT) 12/26/2019  1. How often do you have a drink containing alcohol? 0  2. How many drinks containing alcohol do you have on a typical day when you are drinking? 0  3. How often do you have six or more drinks on one occasion? 0  AUDIT-C Score 0  Alcohol Brief Interventions/Follow-up AUDIT Score <7 follow-up not indicated   A score of 3 or more in women, and 4 or more in men indicates increased risk for alcohol abuse, EXCEPT if all of the points are from  question 1   Results for orders placed or performed in visit on 12/26/19  CBC with Differential/Platelet  Result Value Ref Range   WBC 6.2 3.4 - 10.8 x10E3/uL   RBC 4.21 3.77 - 5.28 x10E6/uL   Hemoglobin 12.9 11.1 - 15.9 g/dL   Hematocrit 39.1 34.0 - 46.6 %   MCV 93 79 - 97 fL   MCH 30.6 26.6 - 33.0 pg   MCHC 33.0 31 - 35 g/dL   RDW 12.3 11.7 - 15.4 %   Platelets 307 150 - 450 x10E3/uL   Neutrophils 58 Not Estab. %   Lymphs 32 Not Estab. %   Monocytes 6 Not Estab. %   Eos 2 Not Estab. %   Basos 1 Not Estab. %   Neutrophils Absolute 3.6 1 - 7 x10E3/uL   Lymphocytes Absolute 2.0 0 - 3 x10E3/uL   Monocytes Absolute 0.4 0 - 0 x10E3/uL   EOS (ABSOLUTE) 0.1 0.0 - 0.4 x10E3/uL   Basophils Absolute 0.1 0 - 0 x10E3/uL   Immature Granulocytes 1 Not Estab. %   Immature Grans (Abs) 0.0 0.0 - 0.1 x10E3/uL  Comprehensive metabolic panel  Result Value Ref Range   Glucose 119 (H) 65 - 99 mg/dL   BUN 14 8 - 27 mg/dL   Creatinine, Ser 0.66 0.57 - 1.00 mg/dL   GFR calc non Af Amer 92 >59 mL/min/1.73   GFR calc Af Amer 106 >59 mL/min/1.73   BUN/Creatinine Ratio 21 12 - 28   Sodium 142 134 - 144 mmol/L   Potassium 4.4 3.5 - 5.2 mmol/L   Chloride 102 96 - 106 mmol/L   CO2 24 20 - 29 mmol/L  Calcium 9.5 8.7 - 10.3 mg/dL   Total Protein 6.9 6.0 - 8.5 g/dL   Albumin 4.7 3.8 - 4.8 g/dL   Globulin, Total 2.2 1.5 - 4.5 g/dL   Albumin/Globulin Ratio 2.1 1.2 - 2.2   Bilirubin Total 0.6 0.0 - 1.2 mg/dL   Alkaline Phosphatase 92 44 - 121 IU/L   AST 30 0 - 40 IU/L   ALT 30 0 - 32 IU/L  Lipid panel  Result Value Ref Range   Cholesterol, Total 150 100 - 199 mg/dL   Triglycerides 105 0 - 149 mg/dL   HDL 53 >39 mg/dL   VLDL Cholesterol Cal 19 5 - 40 mg/dL   LDL Chol Calc (NIH) 78 0 - 99 mg/dL   Chol/HDL Ratio 2.8 0.0 - 4.4 ratio  Hemoglobin A1c  Result Value Ref Range   Hgb A1c MFr Bld 6.3 (H) 4.8 - 5.6 %   Est. average glucose Bld gHb Est-mCnc 134 mg/dL  TSH  Result Value Ref Range   TSH  0.941 0.450 - 4.500 uIU/mL  POCT UA - Microalbumin  Result Value Ref Range   Microalbumin Ur, POC 20 mg/L  POCT Urinalysis Dipstick  Result Value Ref Range   Color, UA Yellow    Clarity, UA Clear    Glucose, UA Negative Negative   Bilirubin, UA Negative    Ketones, UA Negative    Spec Grav, UA 1.020 1.010 - 1.025   Blood, UA Trace    pH, UA 6.0 5.0 - 8.0   Protein, UA Negative Negative   Urobilinogen, UA 0.2 0.2 or 1.0 E.U./dL   Nitrite, UA Negative    Leukocytes, UA Negative Negative   Appearance     Odor      Assessment & Plan     Annual wellness visit done today including the all of the following: Reviewed patient's Family Medical History Reviewed and updated list of patient's medical providers Assessment of cognitive impairment was done Assessed patient's functional ability Established a written schedule for health screening services Health Risk Assessent Completed and Reviewed  Exercise Activities and Dietary recommendations Goals    . Exercise 150 minutes per week (moderate activity)       Immunization History  Administered Date(s) Administered  . Fluad Quad(high Dose 65+) 12/15/2018, 12/26/2019  . Influenza,inj,Quad PF,6+ Mos 03/05/2015, 03/05/2016  . Influenza-Unspecified 11/11/2017  . PFIZER SARS-COV-2 Vaccination 06/29/2019, 07/20/2019  . Pneumococcal Conjugate-13 12/15/2018  . Pneumococcal Polysaccharide-23 02/11/2012, 11/11/2017  . Td 04/23/1999  . Tdap 10/17/2010  . Zoster 04/05/2010  . Zoster Recombinat (Shingrix) 01/10/2019, 03/21/2019    Health Maintenance  Topic Date Due  . FOOT EXAM  Never done  . HEMOGLOBIN A1C  06/25/2020  . TETANUS/TDAP  10/16/2020  . OPHTHALMOLOGY EXAM  12/25/2020  . URINE MICROALBUMIN  12/25/2020  . MAMMOGRAM  12/26/2021  . COLONOSCOPY  01/19/2028  . INFLUENZA VACCINE  Completed  . DEXA SCAN  Completed  . COVID-19 Vaccine  Completed  . Hepatitis C Screening  Completed  . PNA vac Low Risk Adult  Completed      Discussed health benefits of physical activity, and encouraged her to engage in regular exercise appropriate for her age and condition.    1. Annual physical exam Normal physical exam today. Will check labs as below and f/u pending lab results. If labs are stable and WNL she will not need to have these rechecked for one year at her next annual physical exam. She is to call the office in  the meantime if she has any acute issue, questions or concerns. - TSH  2. Encounter for breast cancer screening using non-mammogram modality There is no family history of breast cancer. She does perform regular self breast exams. Mammogram was ordered as below. Information for [facility] was given to patient so she may schedule her mammogram at her convenience.  3. Diabetic retinopathy associated with controlled type 2 diabetes mellitus ([facility]) Stable. Continue Metformin 500mg  BID. Will check labs as below and f/u pending results. - Hemoglobin A1c  4. Type 2 diabetes mellitus with both eyes affected by mild nonproliferative retinopathy without macular edema, without long-term current use of insulin ([facility]) Microalbumin normal. Continue metformin. Will check labs as below and f/u pending results. - CBC with Differential/Platelet - Comprehensive metabolic panel - Lipid panel - Hemoglobin A1c - POCT UA - Microalbumin - glucose blood test strip; To check blood sugar daily  Dispense: 100 each; Refill: 12 - metFORMIN (GLUCOPHAGE) 500 MG tablet; Take 1 tablet (500 mg total) by mouth 2 (two) times daily with a meal.  Dispense: 180 tablet; Refill: 3 - Microlet Lancets MISC; Use once daily to check blood sugar  Dispense: 100 each; Refill: 12  5. Hypercholesteremia Stable. Continue Simvastatin 20mg . Will check labs as below and f/u pending results. - CBC with Differential/Platelet - Comprehensive metabolic panel - Lipid panel - Hemoglobin A1c - simvastatin (ZOCOR) 20 MG tablet; Take 1 tablet (20 mg  total) by mouth daily.  Dispense: 90 tablet; Refill: 3  6. BMI 27.0-27.9,adult Counseled patient on healthy lifestyle modifications including dieting and exercise. Will check labs as below and f/u pending results. - CBC with Differential/Platelet - Comprehensive metabolic panel - Lipid panel - Hemoglobin A1c - TSH  7. Family history of bladder cancer UA normal.  - POCT Urinalysis Dipstick  8. Essential hypertension Stable. Diagnosis pulled for medication refill. Continue current medical treatment plan. - amLODipine (NORVASC) 10 MG tablet; Take 1 tablet (10 mg total) by mouth daily.  Dispense: 90 tablet; Refill: 3  9. Gastroesophageal reflux disease without esophagitis Stable. Diagnosis pulled for medication refill. Continue current medical treatment plan. - omeprazole (PRILOSEC) 20 MG capsule; Take 1 capsule (20 mg total) by mouth daily.  Dispense: 90 capsule; Refill: 3  10. Need for influenza vaccination Flu vaccine given today without complication. Patient sat upright for 15 minutes to check for adverse reaction before being released. - Flu Vaccine QUAD High Dose(Fluad)   Return in about 1 year (around 12/25/2020).     Reynolds Bowl, PA-C, have reviewed all documentation for this visit. The documentation on 12/29/19 for the exam, diagnosis, procedures, and orders are all accurate and complete.   Rubye Beach  [facility] [phone] (phone) [phone] (fax)  [place]

## 2019-12-27 ENCOUNTER — Ambulatory Visit
Admission: RE | Admit: 2019-12-27 | Discharge: 2019-12-27 | Disposition: A | Discharge status: Home / Self Care | Payer: Medicare Other | Source: Ambulatory Visit | Attending: Physician Assistant | Admitting: Physician Assistant

## 2019-12-27 ENCOUNTER — Telehealth: Payer: Self-pay

## 2019-12-27 DIAGNOSIS — Z1231 Encounter for screening mammogram for malignant neoplasm of breast: Secondary | ICD-10-CM | POA: Diagnosis not present

## 2019-12-27 LAB — CBC WITH DIFFERENTIAL/PLATELET
Basophils Absolute: 0.1 10*3/uL (ref 0.0–0.2)
Basos: 1 %
EOS (ABSOLUTE): 0.1 10*3/uL (ref 0.0–0.4)
Eos: 2 %
Hematocrit: 39.1 % (ref 34.0–46.6)
Hemoglobin: 12.9 g/dL (ref 11.1–15.9)
Immature Grans (Abs): 0 10*3/uL (ref 0.0–0.1)
Immature Granulocytes: 1 %
Lymphocytes Absolute: 2 10*3/uL (ref 0.7–3.1)
Lymphs: 32 %
MCH: 30.6 pg (ref 26.6–33.0)
MCHC: 33 g/dL (ref 31.5–35.7)
MCV: 93 fL (ref 79–97)
Monocytes Absolute: 0.4 10*3/uL (ref 0.1–0.9)
Monocytes: 6 %
Neutrophils Absolute: 3.6 10*3/uL (ref 1.4–7.0)
Neutrophils: 58 %
Platelets: 307 10*3/uL (ref 150–450)
RBC: 4.21 x10E6/uL (ref 3.77–5.28)
RDW: 12.3 % (ref 11.7–15.4)
WBC: 6.2 10*3/uL (ref 3.4–10.8)

## 2019-12-27 LAB — LIPID PANEL
Chol/HDL Ratio: 2.8 ratio (ref 0.0–4.4)
Cholesterol, Total: 150 mg/dL (ref 100–199)
HDL: 53 mg/dL (ref >39)
LDL Chol Calc (NIH): 78 mg/dL (ref 0–99)
Triglycerides: 105 mg/dL (ref 0–149)
VLDL Cholesterol Cal: 19 mg/dL (ref 5–40)

## 2019-12-27 LAB — COMPREHENSIVE METABOLIC PANEL
ALT: 30 IU/L (ref 0–32)
AST: 30 IU/L (ref 0–40)
Albumin/Globulin Ratio: 2.1 (ref 1.2–2.2)
Albumin: 4.7 g/dL (ref 3.8–4.8)
Alkaline Phosphatase: 92 IU/L (ref 44–121)
BUN/Creatinine Ratio: 21 (ref 12–28)
BUN: 14 mg/dL (ref 8–27)
Bilirubin Total: 0.6 mg/dL (ref 0.0–1.2)
CO2: 24 mmol/L (ref 20–29)
Calcium: 9.5 mg/dL (ref 8.7–10.3)
Chloride: 102 mmol/L (ref 96–106)
Creatinine, Ser: 0.66 mg/dL (ref 0.57–1.00)
GFR calc Af Amer: 106 mL/min/{1.73_m2} (ref >59)
GFR calc non Af Amer: 92 mL/min/{1.73_m2} (ref >59)
Globulin, Total: 2.2 g/dL (ref 1.5–4.5)
Glucose: 119 mg/dL — ABNORMAL HIGH (ref 65–99)
Potassium: 4.4 mmol/L (ref 3.5–5.2)
Sodium: 142 mmol/L (ref 134–144)
Total Protein: 6.9 g/dL (ref 6.0–8.5)

## 2019-12-27 LAB — TSH: TSH: 0.941 u[IU]/mL (ref 0.450–4.500)

## 2019-12-27 LAB — HEMOGLOBIN A1C
Est. average glucose Bld gHb Est-mCnc: 134 mg/dL
Hgb A1c MFr Bld: 6.3 % — ABNORMAL HIGH (ref 4.8–5.6)

## 2019-12-27 NOTE — Telephone Encounter (Signed)
-----   Message from Mar Daring, [place] sent at 12/27/2019  8:58 AM EDT ----- Blood count is normal. Kidney and liver function are normal. Sodium, potassium, and calcium are normal. Cholesterol is great. A1c is stable at 6.3. Thyroid is normal.

## 2019-12-27 NOTE — Telephone Encounter (Signed)
Patient advised as directed below. 

## 2019-12-28 ENCOUNTER — Telehealth: Payer: Self-pay

## 2019-12-28 NOTE — Telephone Encounter (Signed)
-----   Message from Mar Daring, [place] sent at 12/28/2019  1:03 PM EDT ----- Normal mammogram. Repeat screening in one year.

## 2019-12-28 NOTE — Telephone Encounter (Signed)
Written by Mar Daring, PA-C on 12/28/2019 1:03 PM EDT Seen by patient Laurie Watson on 12/28/2019 3:10 PM

## 2020-02-23 DIAGNOSIS — Z20822 Contact with and (suspected) exposure to covid-19: Secondary | ICD-10-CM | POA: Diagnosis not present

## 2020-03-05 DIAGNOSIS — Z1152 Encounter for screening for COVID-19: Secondary | ICD-10-CM | POA: Diagnosis not present

## 2020-03-05 DIAGNOSIS — Z03818 Encounter for observation for suspected exposure to other biological agents ruled out: Secondary | ICD-10-CM | POA: Diagnosis not present

## 2020-03-26 ENCOUNTER — Ambulatory Visit: Payer: Medicare Other | Attending: Internal Medicine

## 2020-03-26 DIAGNOSIS — Z23 Encounter for immunization: Secondary | ICD-10-CM

## 2020-03-26 NOTE — Progress Notes (Signed)
   Covid-19 Vaccination Clinic  Name:  Laurie Watson    MRN: [id number] DOB: 21-Aug-1950  03/26/2020  Laurie Watson was observed post Covid-19 immunization for 15 minutes without incident. She was provided with Vaccine Information Sheet and instruction to access the V-Safe system.   Laurie Watson was instructed to call 911 with any severe reactions post vaccine: Marland Kitchen Difficulty breathing  . Swelling of face and throat  . A fast heartbeat  . A bad rash all over body  . Dizziness and weakness   Immunizations Administered    Name Date Dose VIS Date Route   Pfizer COVID-19 Vaccine 03/26/2020  3:13 PM 0.3 mL 01/11/2020 Intramuscular   Manufacturer: [employer], [employer]   Lot: [id number]   NDC: [address]-2

## 2020-05-18 ENCOUNTER — Telehealth: Payer: Self-pay

## 2020-05-18 NOTE — Telephone Encounter (Signed)
Copied from [place] [phone]. Topic: General - Other >> May 18, 2020  3:57 PM Leonides Schanz, [place] wrote: Reason for CRM: Pt stated she just received the letter about Fenton Malling leaving and her husband Kember Boch along with his brother and his wife all see Dr. Rosanna Randy so she wanted to ask if Dr. Rosanna Randy would accept her as a patient. Pt requests call back

## 2020-05-23 NOTE — Telephone Encounter (Signed)
Okay 

## 2020-05-24 NOTE — Telephone Encounter (Signed)
PCP updated.

## 2020-07-19 DIAGNOSIS — Z20822 Contact with and (suspected) exposure to covid-19: Secondary | ICD-10-CM | POA: Diagnosis not present

## 2020-07-19 DIAGNOSIS — Z03818 Encounter for observation for suspected exposure to other biological agents ruled out: Secondary | ICD-10-CM | POA: Diagnosis not present

## 2020-08-13 DIAGNOSIS — D2271 Melanocytic nevi of right lower limb, including hip: Secondary | ICD-10-CM | POA: Diagnosis not present

## 2020-08-13 DIAGNOSIS — D2262 Melanocytic nevi of left upper limb, including shoulder: Secondary | ICD-10-CM | POA: Diagnosis not present

## 2020-08-13 DIAGNOSIS — X32XXXA Exposure to sunlight, initial encounter: Secondary | ICD-10-CM | POA: Diagnosis not present

## 2020-08-13 DIAGNOSIS — L820 Inflamed seborrheic keratosis: Secondary | ICD-10-CM | POA: Diagnosis not present

## 2020-08-13 DIAGNOSIS — D2261 Melanocytic nevi of right upper limb, including shoulder: Secondary | ICD-10-CM | POA: Diagnosis not present

## 2020-08-13 DIAGNOSIS — Z85828 Personal history of other malignant neoplasm of skin: Secondary | ICD-10-CM | POA: Diagnosis not present

## 2020-08-13 DIAGNOSIS — L538 Other specified erythematous conditions: Secondary | ICD-10-CM | POA: Diagnosis not present

## 2020-08-13 DIAGNOSIS — L570 Actinic keratosis: Secondary | ICD-10-CM | POA: Diagnosis not present

## 2020-10-11 DIAGNOSIS — Z20822 Contact with and (suspected) exposure to covid-19: Secondary | ICD-10-CM | POA: Diagnosis not present

## 2020-10-11 DIAGNOSIS — Z03818 Encounter for observation for suspected exposure to other biological agents ruled out: Secondary | ICD-10-CM | POA: Diagnosis not present

## 2020-11-15 ENCOUNTER — Telehealth: Payer: Self-pay | Admitting: Family Medicine

## 2020-11-15 DIAGNOSIS — Z1231 Encounter for screening mammogram for malignant neoplasm of breast: Secondary | ICD-10-CM

## 2020-11-15 NOTE — Telephone Encounter (Signed)
Pt called [facility] to schedule her mammogram for the same day she has her CPE appt / she was advised that Dr. Rosanna Randy needs to send them an order first / please advise pt when this has been sent

## 2020-11-15 NOTE — Telephone Encounter (Signed)
Is this okay to order?  Thanks,   -Mickel Baas

## 2020-11-20 NOTE — Telephone Encounter (Signed)
Patient called still waiting on orders for mammogram. PLease call back

## 2020-11-22 NOTE — Telephone Encounter (Signed)
Pt called back in to follow up on order being placed for mammogram. Advised that order will be placed per PCP approval.    Please assist with this further.

## 2020-11-22 NOTE — Telephone Encounter (Signed)
Mammogram ordered. Patient advised.

## 2020-12-16 ENCOUNTER — Other Ambulatory Visit: Payer: Self-pay | Admitting: Physician Assistant

## 2020-12-16 DIAGNOSIS — E113293 Type 2 diabetes mellitus with mild nonproliferative diabetic retinopathy without macular edema, bilateral: Secondary | ICD-10-CM

## 2020-12-29 ENCOUNTER — Other Ambulatory Visit: Payer: Self-pay | Admitting: Physician Assistant

## 2020-12-29 DIAGNOSIS — E7800 Pure hypercholesterolemia, unspecified: Secondary | ICD-10-CM

## 2020-12-29 DIAGNOSIS — K219 Gastro-esophageal reflux disease without esophagitis: Secondary | ICD-10-CM

## 2020-12-29 DIAGNOSIS — I10 Essential (primary) hypertension: Secondary | ICD-10-CM

## 2021-01-01 ENCOUNTER — Ambulatory Visit
Admission: RE | Admit: 2021-01-01 | Discharge: 2021-01-01 | Disposition: A | Discharge status: Home / Self Care | Payer: Medicare Other | Source: Ambulatory Visit | Attending: Family Medicine | Admitting: Family Medicine

## 2021-01-01 ENCOUNTER — Other Ambulatory Visit: Payer: Self-pay

## 2021-01-01 ENCOUNTER — Ambulatory Visit (INDEPENDENT_AMBULATORY_CARE_PROVIDER_SITE_OTHER): Payer: Medicare Other | Admitting: Family Medicine

## 2021-01-01 ENCOUNTER — Encounter: Payer: Self-pay | Admitting: Family Medicine

## 2021-01-01 VITALS — BP 148/76 | HR 68 | Temp 97.8°F | Resp 16 | Ht 63.0 in | Wt 158.0 lb

## 2021-01-01 DIAGNOSIS — I10 Essential (primary) hypertension: Secondary | ICD-10-CM

## 2021-01-01 DIAGNOSIS — E7800 Pure hypercholesterolemia, unspecified: Secondary | ICD-10-CM

## 2021-01-01 DIAGNOSIS — E11319 Type 2 diabetes mellitus with unspecified diabetic retinopathy without macular edema: Secondary | ICD-10-CM

## 2021-01-01 DIAGNOSIS — E113293 Type 2 diabetes mellitus with mild nonproliferative diabetic retinopathy without macular edema, bilateral: Secondary | ICD-10-CM | POA: Diagnosis not present

## 2021-01-01 DIAGNOSIS — Z23 Encounter for immunization: Secondary | ICD-10-CM

## 2021-01-01 DIAGNOSIS — Z0000 Encounter for general adult medical examination without abnormal findings: Secondary | ICD-10-CM | POA: Diagnosis not present

## 2021-01-01 DIAGNOSIS — Z1231 Encounter for screening mammogram for malignant neoplasm of breast: Secondary | ICD-10-CM | POA: Insufficient documentation

## 2021-01-01 DIAGNOSIS — K219 Gastro-esophageal reflux disease without esophagitis: Secondary | ICD-10-CM

## 2021-01-01 NOTE — Progress Notes (Signed)
I,April Miller,acting as a scribe for Wilhemena Durie, MD.,have documented all relevant documentation on the behalf of Wilhemena Durie, MD,as directed by  Wilhemena Durie, MD while in the presence of Wilhemena Durie, MD.  Annual Wellness Visit     Patient: Laurie Watson, Female    DOB: [birth year], 70 y.o.   MRN: [id number] Visit Date: 01/01/2021  Today's Provider: Wilhemena Durie, MD   Chief Complaint  Patient presents with   Medicare Wellness   Subjective    Laurie Watson is a 70 y.o. female who presents today for her Annual Wellness Visit. She reports consuming a general diet. Home exercise routine includes walking. She generally feels fairly well. She reports sleeping fairly well. She does not have additional problems to discuss today.   HPI All problems are stable.  She does have a 13 year old daughter with breast cancer.  There is a triple negative breast cancer.  She is married and has 2 daughters and 4 grandchildren.   Medications: Outpatient Medications Prior to Visit  Medication Sig   amLODipine (NORVASC) 10 MG tablet Take 1 tablet (10 mg total) by mouth daily.   aspirin 81 MG chewable tablet Chew 1 tablet by mouth daily.   cholecalciferol (VITAMIN D) 1000 UNITS tablet Take 1 tablet by mouth daily.   Cinnamon 500 MG capsule Take 500 mg by mouth daily.   docusate sodium (COLACE) 100 MG capsule Take 1 capsule by mouth daily.   glucose blood test strip To check blood sugar daily   metFORMIN (GLUCOPHAGE) 500 MG tablet TAKE 1 TABLET (500 MG TOTAL) BY MOUTH 2 (TWO) TIMES DAILY WITH A MEAL.   Microlet Lancets MISC Use once daily to check blood sugar   Omega-3 Fatty Acids (FISH OIL BURP-LESS) 1000 MG CAPS Take 1 capsule by mouth daily.   omeprazole (PRILOSEC) 20 MG capsule Take 1 capsule (20 mg total) by mouth daily.   Probiotic CAPS Take 1 capsule by mouth daily.   simvastatin (ZOCOR) 20 MG tablet Take 1 tablet (20 mg total) by mouth daily.   No  facility-administered medications prior to visit.    Allergies  Allergen Reactions   Codeine Nausea Only   Penicillins Rash    Patient Care Team: Jerrol Banana., MD as PCP - General (Family Medicine)  Review of Systems  All other systems reviewed and are negative.       Objective    Vitals: BP (!) 148/76 (BP Location: Right Arm, Cuff Size: Normal)   Pulse 68   Temp 97.8 F (36.6 C) (Temporal)   Resp 16   Ht 5\' 3"  (1.6 m)   Wt 158 lb (71.7 kg)   SpO2 96%   BMI 27.99 kg/m  BP Readings from Last 3 Encounters:  01/01/21 (!) 148/76  12/26/19 134/64  12/15/18 (!) 152/77   Wt Readings from Last 3 Encounters:  01/01/21 158 lb (71.7 kg)  12/26/19 154 lb 6.4 oz (70 kg)  12/15/18 158 lb 9.6 oz (71.9 kg)      Physical Exam Vitals reviewed.  Constitutional:      General: She is not in acute distress.    Appearance: Normal appearance. She is well-developed, well-groomed and overweight. She is not ill-appearing or diaphoretic.  HENT:     Head: Normocephalic and atraumatic.     Right Ear: Tympanic membrane, ear canal and external ear normal.     Left Ear: Tympanic membrane, ear canal and external ear normal.  Nose: Nose normal.     Mouth/Throat:     Mouth: Mucous membranes are moist.     Pharynx: Oropharynx is clear. No oropharyngeal exudate.  Eyes:     General: No scleral icterus.       Right eye: No discharge.        Left eye: No discharge.     Extraocular Movements: Extraocular movements intact.     Conjunctiva/sclera: Conjunctivae normal.     Pupils: Pupils are equal, round, and reactive to light.  Neck:     Thyroid: No thyromegaly.     Vascular: No carotid bruit or JVD.     Trachea: No tracheal deviation.  Cardiovascular:     Rate and Rhythm: Normal rate and regular rhythm.     Pulses: Normal pulses.     Heart sounds: Normal heart sounds. No murmur heard.   No friction rub. No gallop.  Pulmonary:     Effort: Pulmonary effort is normal. No  respiratory distress.     Breath sounds: Normal breath sounds. No wheezing or rales.  Chest:     Chest wall: No tenderness.  Abdominal:     General: Abdomen is flat. Bowel sounds are normal. There is no distension.     Palpations: Abdomen is soft. There is no mass.     Tenderness: There is no abdominal tenderness. There is no guarding or rebound.  Musculoskeletal:        General: No tenderness. Normal range of motion.     Cervical back: Normal range of motion and neck supple. No tenderness.     Right lower leg: No edema.     Left lower leg: No edema.  Lymphadenopathy:     Cervical: No cervical adenopathy.  Skin:    General: Skin is warm and dry.     Capillary Refill: Capillary refill takes less than 2 seconds.     Findings: No rash.  Neurological:     General: No focal deficit present.     Mental Status: She is alert and oriented to person, place, and time. Mental status is at baseline.  Psychiatric:        Mood and Affect: Mood normal.        Behavior: Behavior normal. Behavior is cooperative.        Thought Content: Thought content normal.        Judgment: Judgment normal.     Most recent functional status assessment: In your present state of health, do you have any difficulty performing the following activities: 01/01/2021  Hearing? N  Vision? N  Difficulty concentrating or making decisions? N  Walking or climbing stairs? N  Dressing or bathing? N  Doing errands, shopping? N  Some recent data might be hidden   Most recent fall risk assessment: Fall Risk  01/01/2021  Falls in the past year? 0  Number falls in past yr: 0  Injury with Fall? 0  Risk for fall due to : No Fall Risks  Follow up Falls evaluation completed    Most recent depression screenings: PHQ 2/9 Scores 01/01/2021 12/15/2018  PHQ - 2 Score 1 0  PHQ- 9 Score 1 -   Most recent cognitive screening: No flowsheet data found. Most recent Audit-C alcohol use screening Alcohol Use Disorder Test (AUDIT)  01/01/2021  1. How often do you have a drink containing alcohol? 1  2. How many drinks containing alcohol do you have on a typical day when you are drinking? 0  3. How often  do you have six or more drinks on one occasion? 0  AUDIT-C Score 1  Alcohol Brief Interventions/Follow-up -   A score of 3 or more in women, and 4 or more in men indicates increased risk for alcohol abuse, EXCEPT if all of the points are from question 1   No results found for any visits on 01/01/21.  Assessment & Plan     Annual wellness visit done today including the all of the following: Reviewed patient's Family Medical History Reviewed and updated list of patient's medical providers Assessment of cognitive impairment was done Assessed patient's functional ability Established a written schedule for health screening [place] Completed and Reviewed  Exercise Activities and Dietary recommendations  Goals      Exercise 150 minutes per week (moderate activity)        Immunization History  Administered Date(s) Administered   Fluad Quad(high Dose 65+) 12/15/2018, 12/26/2019, 01/01/2021   Influenza,inj,Quad PF,6+ Mos 03/05/2015, 03/05/2016   Influenza-Unspecified 11/11/2017   PFIZER(Purple Top)SARS-COV-2 Vaccination 06/29/2019, 07/20/2019, 03/26/2020   Pneumococcal Conjugate-13 12/15/2018   Pneumococcal Polysaccharide-23 02/11/2012, 11/11/2017   Td 04/23/1999   Tdap 10/17/2010   Zoster Recombinat (Shingrix) 01/10/2019, 03/21/2019   Zoster, Live 04/05/2010    Health Maintenance  Topic Date Due   FOOT EXAM  Never done   HEMOGLOBIN A1C  06/25/2020   COVID-19 Vaccine (4 - Booster for [place] series) 07/24/2020   OPHTHALMOLOGY EXAM  10/06/2020   TETANUS/TDAP  10/16/2020   URINE MICROALBUMIN  12/25/2020   MAMMOGRAM  12/26/2021   COLONOSCOPY (Pts 45-22yrs Insurance coverage will need to be confirmed)  01/19/2028   INFLUENZA VACCINE  Completed   DEXA SCAN  Completed   Hepatitis C  Screening  Completed   Zoster Vaccines- Shingrix  Completed   HPV VACCINES  Aged Out     Discussed health benefits of physical activity, and encouraged her to engage in regular exercise appropriate for her age and condition.    1. Encounter for annual wellness exam in Medicare patient  - Lipid panel - TSH - CBC w/Diff/Platelet - Comprehensive Metabolic Panel (CMET) - Hemoglobin A1c  2. Type 2 diabetes mellitus with both eyes affected by mild nonproliferative retinopathy without macular edema, without long-term current use of insulin (HCC) Foot exam WNL. - Lipid panel - TSH - CBC w/Diff/Platelet - Comprehensive Metabolic Panel (CMET) - Hemoglobin A1c  3. Hypercholesteremia  - Lipid panel - TSH - CBC w/Diff/Platelet - Comprehensive Metabolic Panel (CMET) - Hemoglobin A1c  4. Essential hypertension  - Lipid panel - TSH - CBC w/Diff/Platelet - Comprehensive Metabolic Panel (CMET) - Hemoglobin A1c  5. Diabetic retinopathy associated with controlled type 2 diabetes mellitus (HCC)  - Lipid panel - TSH - CBC w/Diff/Platelet - Comprehensive Metabolic Panel (CMET) - Hemoglobin A1c  6. Gastroesophageal reflux disease without esophagitis  - Lipid panel - TSH - CBC w/Diff/Platelet - Comprehensive Metabolic Panel (CMET) - Hemoglobin A1c  7. Need for influenza vaccination  - Flu Vaccine QUAD High Dose(Fluad)   Return in about 6 months (around 07/02/2021).     I, Wilhemena Durie, MD, have reviewed all documentation for this visit. The documentation on 01/12/21 for the exam, diagnosis, procedures, and orders are all accurate and complete.    Patrena Santalucia Cranford Mon, MD  [facility] [phone] (phone) [phone] (fax)  [place]

## 2021-01-02 LAB — COMPREHENSIVE METABOLIC PANEL
ALT: 32 IU/L (ref 0–32)
AST: 36 IU/L (ref 0–40)
Albumin/Globulin Ratio: 2.1 (ref 1.2–2.2)
Albumin: 4.7 g/dL (ref 3.8–4.8)
Alkaline Phosphatase: 95 IU/L (ref 44–121)
BUN/Creatinine Ratio: 19 (ref 12–28)
BUN: 12 mg/dL (ref 8–27)
Bilirubin Total: 0.7 mg/dL (ref 0.0–1.2)
CO2: 24 mmol/L (ref 20–29)
Calcium: 10 mg/dL (ref 8.7–10.3)
Chloride: 99 mmol/L (ref 96–106)
Creatinine, Ser: 0.64 mg/dL (ref 0.57–1.00)
Globulin, Total: 2.2 g/dL (ref 1.5–4.5)
Glucose: 136 mg/dL — ABNORMAL HIGH (ref 70–99)
Potassium: 4.7 mmol/L (ref 3.5–5.2)
Sodium: 140 mmol/L (ref 134–144)
Total Protein: 6.9 g/dL (ref 6.0–8.5)
eGFR: 96 mL/min/{1.73_m2} (ref >59)

## 2021-01-02 LAB — CBC WITH DIFFERENTIAL/PLATELET
Basophils Absolute: 0 10*3/uL (ref 0.0–0.2)
Basos: 1 %
EOS (ABSOLUTE): 0.1 10*3/uL (ref 0.0–0.4)
Eos: 2 %
Hematocrit: 41.4 % (ref 34.0–46.6)
Hemoglobin: 13.6 g/dL (ref 11.1–15.9)
Immature Grans (Abs): 0 10*3/uL (ref 0.0–0.1)
Immature Granulocytes: 0 %
Lymphocytes Absolute: 1.8 10*3/uL (ref 0.7–3.1)
Lymphs: 34 %
MCH: 29.5 pg (ref 26.6–33.0)
MCHC: 32.9 g/dL (ref 31.5–35.7)
MCV: 90 fL (ref 79–97)
Monocytes Absolute: 0.4 10*3/uL (ref 0.1–0.9)
Monocytes: 7 %
Neutrophils Absolute: 2.9 10*3/uL (ref 1.4–7.0)
Neutrophils: 56 %
Platelets: 278 10*3/uL (ref 150–450)
RBC: 4.61 x10E6/uL (ref 3.77–5.28)
RDW: 12.5 % (ref 11.7–15.4)
WBC: 5.2 10*3/uL (ref 3.4–10.8)

## 2021-01-02 LAB — HEMOGLOBIN A1C
Est. average glucose Bld gHb Est-mCnc: 154 mg/dL
Hgb A1c MFr Bld: 7 % — ABNORMAL HIGH (ref 4.8–5.6)

## 2021-01-02 LAB — LIPID PANEL
Chol/HDL Ratio: 2.9 ratio (ref 0.0–4.4)
Cholesterol, Total: 151 mg/dL (ref 100–199)
HDL: 52 mg/dL (ref >39)
LDL Chol Calc (NIH): 76 mg/dL (ref 0–99)
Triglycerides: 129 mg/dL (ref 0–149)
VLDL Cholesterol Cal: 23 mg/dL (ref 5–40)

## 2021-01-02 LAB — TSH: TSH: 1.05 u[IU]/mL (ref 0.450–4.500)

## 2021-01-03 ENCOUNTER — Other Ambulatory Visit: Payer: Self-pay | Admitting: *Deleted

## 2021-01-03 DIAGNOSIS — E113293 Type 2 diabetes mellitus with mild nonproliferative diabetic retinopathy without macular edema, bilateral: Secondary | ICD-10-CM

## 2021-01-03 MED ORDER — METFORMIN HCL 1000 MG PO TABS: 1000.0000 mg | ORAL_TABLET | Freq: Two times a day (BID) | ORAL | 3 refills | Status: DC

## 2021-01-07 ENCOUNTER — Other Ambulatory Visit: Payer: Self-pay | Admitting: Internal Medicine

## 2021-01-07 ENCOUNTER — Other Ambulatory Visit: Payer: Self-pay | Admitting: Family Medicine

## 2021-01-07 DIAGNOSIS — R928 Other abnormal and inconclusive findings on diagnostic imaging of breast: Secondary | ICD-10-CM

## 2021-01-07 DIAGNOSIS — N6489 Other specified disorders of breast: Secondary | ICD-10-CM

## 2021-01-17 ENCOUNTER — Ambulatory Visit
Admission: RE | Admit: 2021-01-17 | Discharge: 2021-01-17 | Disposition: A | Discharge status: Home / Self Care | Payer: Medicare Other | Source: Ambulatory Visit | Attending: Family Medicine | Admitting: Family Medicine

## 2021-01-17 ENCOUNTER — Other Ambulatory Visit: Payer: Self-pay

## 2021-01-17 DIAGNOSIS — N6311 Unspecified lump in the right breast, upper outer quadrant: Secondary | ICD-10-CM | POA: Diagnosis not present

## 2021-01-17 DIAGNOSIS — R922 Inconclusive mammogram: Secondary | ICD-10-CM | POA: Diagnosis not present

## 2021-01-17 DIAGNOSIS — R928 Other abnormal and inconclusive findings on diagnostic imaging of breast: Secondary | ICD-10-CM

## 2021-01-17 DIAGNOSIS — N6489 Other specified disorders of breast: Secondary | ICD-10-CM | POA: Diagnosis not present

## 2021-01-18 ENCOUNTER — Other Ambulatory Visit: Payer: Self-pay | Admitting: Family Medicine

## 2021-01-18 DIAGNOSIS — R928 Other abnormal and inconclusive findings on diagnostic imaging of breast: Secondary | ICD-10-CM

## 2021-01-18 DIAGNOSIS — N6310 Unspecified lump in the right breast, unspecified quadrant: Secondary | ICD-10-CM

## 2021-01-28 DIAGNOSIS — E113293 Type 2 diabetes mellitus with mild nonproliferative diabetic retinopathy without macular edema, bilateral: Secondary | ICD-10-CM | POA: Diagnosis not present

## 2021-01-28 DIAGNOSIS — H524 Presbyopia: Secondary | ICD-10-CM | POA: Diagnosis not present

## 2021-01-28 DIAGNOSIS — H26102 Unspecified traumatic cataract, left eye: Secondary | ICD-10-CM | POA: Diagnosis not present

## 2021-01-28 DIAGNOSIS — H52211 Irregular astigmatism, right eye: Secondary | ICD-10-CM | POA: Diagnosis not present

## 2021-01-28 DIAGNOSIS — H21532 Iridodialysis, left eye: Secondary | ICD-10-CM | POA: Diagnosis not present

## 2021-01-28 LAB — HM DIABETES EYE EXAM

## 2021-01-29 ENCOUNTER — Other Ambulatory Visit: Payer: Self-pay

## 2021-01-29 ENCOUNTER — Ambulatory Visit
Admission: RE | Admit: 2021-01-29 | Discharge: 2021-01-29 | Disposition: A | Discharge status: Home / Self Care | Payer: Medicare Other | Source: Ambulatory Visit | Attending: Family Medicine | Admitting: Family Medicine

## 2021-01-29 DIAGNOSIS — N6310 Unspecified lump in the right breast, unspecified quadrant: Secondary | ICD-10-CM | POA: Diagnosis not present

## 2021-01-29 DIAGNOSIS — R928 Other abnormal and inconclusive findings on diagnostic imaging of breast: Secondary | ICD-10-CM | POA: Insufficient documentation

## 2021-01-29 DIAGNOSIS — N6011 Diffuse cystic mastopathy of right breast: Secondary | ICD-10-CM | POA: Diagnosis not present

## 2021-01-29 DIAGNOSIS — N6311 Unspecified lump in the right breast, upper outer quadrant: Secondary | ICD-10-CM | POA: Diagnosis not present

## 2021-01-29 HISTORY — PX: BREAST BIOPSY: SHX20

## 2021-01-30 LAB — SURGICAL PATHOLOGY

## 2021-03-15 ENCOUNTER — Other Ambulatory Visit: Payer: Self-pay | Admitting: Family Medicine

## 2021-03-15 DIAGNOSIS — E113293 Type 2 diabetes mellitus with mild nonproliferative diabetic retinopathy without macular edema, bilateral: Secondary | ICD-10-CM

## 2021-03-27 ENCOUNTER — Telehealth: Payer: Self-pay | Admitting: Family Medicine

## 2021-03-27 ENCOUNTER — Other Ambulatory Visit: Payer: Self-pay

## 2021-03-27 DIAGNOSIS — E7800 Pure hypercholesterolemia, unspecified: Secondary | ICD-10-CM

## 2021-03-27 DIAGNOSIS — K219 Gastro-esophageal reflux disease without esophagitis: Secondary | ICD-10-CM

## 2021-03-27 DIAGNOSIS — I10 Essential (primary) hypertension: Secondary | ICD-10-CM

## 2021-03-27 DIAGNOSIS — E113293 Type 2 diabetes mellitus with mild nonproliferative diabetic retinopathy without macular edema, bilateral: Secondary | ICD-10-CM

## 2021-03-27 MED ORDER — OMEPRAZOLE 20 MG PO CPDR: DELAYED_RELEASE_CAPSULE | ORAL | 1 refills | Status: DC

## 2021-03-27 MED ORDER — GLUCOSE BLOOD VI STRP: ORAL_STRIP | 3 refills | Status: DC

## 2021-03-27 MED ORDER — METFORMIN HCL 1000 MG PO TABS: 1000.0000 mg | ORAL_TABLET | Freq: Two times a day (BID) | ORAL | 1 refills | Status: DC

## 2021-03-27 MED ORDER — SIMVASTATIN 20 MG PO TABS: 20.0000 mg | ORAL_TABLET | Freq: Every day | ORAL | 1 refills | Status: DC

## 2021-03-27 MED ORDER — MICROLET LANCETS MISC: 3 refills | Status: DC

## 2021-03-27 MED ORDER — AMLODIPINE BESYLATE 10 MG PO TABS: 10.0000 mg | ORAL_TABLET | Freq: Every day | ORAL | 1 refills | Status: DC

## 2021-03-27 MED ORDER — OMEPRAZOLE 20 MG PO CPDR: 20.0000 mg | DELAYED_RELEASE_CAPSULE | Freq: Every day | ORAL | 1 refills | Status: DC

## 2021-03-27 NOTE — Telephone Encounter (Signed)
Also asking for Trueplus lancets

## 2021-03-27 NOTE — Telephone Encounter (Signed)
[facility] faxed refill request for the following medications:  amLODipine (NORVASC) 10 MG tablet   simvastatin (ZOCOR) 20 MG tablet   metFORMIN (GLUCOPHAGE) 1000 MG tablet  omeprazole (PRILOSEC) 20 MG capsule   glucose blood test strip   Trueplus 33g lancets   Please advise.

## 2021-04-01 ENCOUNTER — Telehealth: Payer: Self-pay | Admitting: *Deleted

## 2021-04-01 ENCOUNTER — Other Ambulatory Visit: Payer: Self-pay | Admitting: *Deleted

## 2021-04-01 DIAGNOSIS — E113293 Type 2 diabetes mellitus with mild nonproliferative diabetic retinopathy without macular edema, bilateral: Secondary | ICD-10-CM

## 2021-04-01 DIAGNOSIS — E7800 Pure hypercholesterolemia, unspecified: Secondary | ICD-10-CM

## 2021-04-01 DIAGNOSIS — K219 Gastro-esophageal reflux disease without esophagitis: Secondary | ICD-10-CM

## 2021-04-01 DIAGNOSIS — I10 Essential (primary) hypertension: Secondary | ICD-10-CM

## 2021-04-01 MED ORDER — GLUCOSE BLOOD VI STRP: ORAL_STRIP | 3 refills | Status: DC

## 2021-04-01 MED ORDER — AMLODIPINE BESYLATE 10 MG PO TABS: 10.0000 mg | ORAL_TABLET | Freq: Every day | ORAL | 1 refills | Status: DC

## 2021-04-01 MED ORDER — SIMVASTATIN 20 MG PO TABS: 20.0000 mg | ORAL_TABLET | Freq: Every day | ORAL | 1 refills | Status: DC

## 2021-04-01 MED ORDER — MICROLET LANCETS MISC: 3 refills | Status: DC

## 2021-04-01 MED ORDER — METFORMIN HCL 1000 MG PO TABS: 1000.0000 mg | ORAL_TABLET | Freq: Two times a day (BID) | ORAL | 1 refills | Status: DC

## 2021-04-01 MED ORDER — OMEPRAZOLE 20 MG PO CPDR: 20.0000 mg | DELAYED_RELEASE_CAPSULE | Freq: Every day | ORAL | 1 refills | Status: DC

## 2021-04-01 NOTE — Telephone Encounter (Signed)
Patient requesting all medications be sent to [facility].

## 2021-04-01 NOTE — Telephone Encounter (Signed)
Patient requesting recent refills to be sent to new pharmacy, [facility]. Sending now and pharmacy has been updated in patient's chart.

## 2021-04-01 NOTE — Telephone Encounter (Signed)
Done

## 2021-04-01 NOTE — Telephone Encounter (Signed)
Opened in error. Please disregard.

## 2021-04-05 NOTE — Telephone Encounter (Signed)
Patient called in states pharmacy hasnt received refill request for Trueplus lancets.Solicitor. Sending now and pharmacy has been updated in patient's chart.  Please call back

## 2021-05-14 ENCOUNTER — Telehealth: Payer: Self-pay | Admitting: Family Medicine

## 2021-05-14 DIAGNOSIS — I10 Essential (primary) hypertension: Secondary | ICD-10-CM

## 2021-05-14 MED ORDER — AMLODIPINE BESYLATE 10 MG PO TABS: 10.0000 mg | ORAL_TABLET | Freq: Every day | ORAL | 1 refills | Status: DC

## 2021-05-14 NOTE — Telephone Encounter (Signed)
[facility] faxed refill request for the following medications:  amLODipine (NORVASC) 10 MG tablet   Please advise.

## 2021-07-01 NOTE — Progress Notes (Signed)
?  ? ? ?Established patient visit ? ?I,Laurie Watson,acting as a scribe for Wilhemena Durie, MD.,have documented all relevant documentation on the behalf of Wilhemena Durie, MD,as directed by  Wilhemena Durie, MD while in the presence of Wilhemena Durie, MD. ? ? ?Patient: Laurie Watson   DOB: 02-07-1951   71 y.o. Female  MRN: [id number] ?Visit Date: 07/02/2021 ? ?Today's healthcare provider: Wilhemena Durie, MD  ? ?Chief Complaint  ?Patient presents with  ? Follow-up  ? Diabetes  ? ?Subjective  ?  ?HPI  ?Patient comes in today for follow-up of chronic medical problems.  She needs a mammogram follow-up for abnormal mammogram 6 months ago.  She has no significant complaints today  ?No cardiac symptoms and no neurologic issues other than tingling in her feet. ?She request a handicap sticker for her chronic foot pain.  This is being followed by podiatry. ?Diabetes Mellitus Type II, follow-up ? ?Lab Results  ?Component Value Date  ? HGBA1C 6.3 (A) 07/02/2021  ? HGBA1C 7.0 (H) 01/01/2021  ? HGBA1C 6.3 (H) 12/26/2019  ? ?Last seen for diabetes 6 months ago.  ?Management since then includes; increased metformin to 1000 mg twice a day.  ?She reports good compliance with treatment. ?She is not having side effects. none ? ?Home blood sugar records: fasting range: 111-153 ? ?Episodes of hypoglycemia? No none ?  ?Current insulin regiment: n/a ?Most Recent Eye Exam: due ? ?--------------------------------------------------------------------------------------------------- ?Hypertension, follow-up ? ?BP Readings from Last 3 Encounters:  ?07/02/21 (!) 185/73  ?01/01/21 (!) 148/76  ?12/26/19 134/64  ? Wt Readings from Last 3 Encounters:  ?07/02/21 154 lb (69.9 kg)  ?01/01/21 158 lb (71.7 kg)  ?12/26/19 154 lb 6.4 oz (70 kg)  ?  ? ?She was last seen for hypertension 6 months ago.  ?BP at that visit was 148/76. ?Management since that visit includes; on amlodipine 10 mg. ?She reports good compliance with treatment. ?She  is not having side effects. none ?She is not exercising. ?She is adherent to low salt diet.   ?Outside blood pressures are 140/73. ? ?She does not smoke. ? ?Use of agents associated with hypertension: none.  ? ?--------------------------------------------------------------------------------------------------- ? ? ?Medications: ?Outpatient Medications Prior to Visit  ?Medication Sig  ? amLODipine (NORVASC) 10 MG tablet Take 1 tablet (10 mg total) by mouth daily.  ? aspirin 81 MG chewable tablet Chew 1 tablet by mouth daily.  ? cholecalciferol (VITAMIN D) 1000 UNITS tablet Take 1 tablet by mouth daily.  ? Cinnamon 500 MG capsule Take 500 mg by mouth daily.  ? docusate sodium (COLACE) 100 MG capsule Take 1 capsule by mouth daily.  ? glucose blood test strip To check blood sugar daily  ? metFORMIN (GLUCOPHAGE) 1000 MG tablet Take 1 tablet (1,000 mg total) by mouth 2 (two) times daily with a meal.  ? Microlet Lancets MISC Use once daily to check blood sugar  ? Omega-3 Fatty Acids (FISH OIL BURP-LESS) 1000 MG CAPS Take 1 capsule by mouth daily.  ? omeprazole (PRILOSEC) 20 MG capsule Take 1 capsule (20 mg total) by mouth daily.  ? Probiotic CAPS Take 1 capsule by mouth daily.  ? simvastatin (ZOCOR) 20 MG tablet Take 1 tablet (20 mg total) by mouth daily.  ? ?No facility-administered medications prior to visit.  ? ? ?Review of Systems  ?Constitutional:  Negative for appetite change, chills, fatigue and fever.  ?Respiratory:  Negative for chest tightness and shortness of breath.   ?Cardiovascular:  Negative  for chest pain and palpitations.  ?Gastrointestinal:  Negative for abdominal pain, nausea and vomiting.  ?Neurological:  Negative for dizziness and weakness.  ? ?Last hemoglobin A1c ?Lab Results  ?Component Value Date  ? HGBA1C 6.3 (A) 07/02/2021  ? ?  ?  Objective  ?  ?BP (!) 185/73 (BP Location: Left Arm, Patient Position: Sitting, Cuff Size: Normal)   Pulse 71   Temp 98.1 ?F (36.7 ?C) (Temporal)   Resp 16   Ht 5'  3" (1.6 m)   Wt 154 lb (69.9 kg)   SpO2 99%   BMI 27.28 kg/m?  ?BP Readings from Last 3 Encounters:  ?07/02/21 (!) 185/73  ?01/01/21 (!) 148/76  ?12/26/19 134/64  ? ?Wt Readings from Last 3 Encounters:  ?07/02/21 154 lb (69.9 kg)  ?01/01/21 158 lb (71.7 kg)  ?12/26/19 154 lb 6.4 oz (70 kg)  ? ?  ? ?Physical Exam ?Vitals reviewed.  ?Constitutional:   ?   General: She is not in acute distress. ?   Appearance: Normal appearance. She is well-developed, well-groomed and overweight. She is not ill-appearing or diaphoretic.  ?HENT:  ?   Head: Normocephalic and atraumatic.  ?   Right Ear: Tympanic membrane, ear canal and external ear normal.  ?   Left Ear: Tympanic membrane, ear canal and external ear normal.  ?   Nose: Nose normal.  ?   Mouth/Throat:  ?   Mouth: Mucous membranes are moist.  ?   Pharynx: Oropharynx is clear. No oropharyngeal exudate.  ?Eyes:  ?   General: No scleral icterus.    ?   Right eye: No discharge.     ?   Left eye: No discharge.  ?   Extraocular Movements: Extraocular movements intact.  ?   Conjunctiva/sclera: Conjunctivae normal.  ?   Pupils: Pupils are equal, round, and reactive to light.  ?Neck:  ?   Thyroid: No thyromegaly.  ?   Vascular: No carotid bruit or JVD.  ?   Trachea: No tracheal deviation.  ?Cardiovascular:  ?   Rate and Rhythm: Normal rate and regular rhythm.  ?   Pulses: Normal pulses.  ?   Heart sounds: Normal heart sounds. No murmur heard. ?  No friction rub. No gallop.  ?Pulmonary:  ?   Effort: Pulmonary effort is normal. No respiratory distress.  ?   Breath sounds: Normal breath sounds. No wheezing or rales.  ?Chest:  ?   Chest wall: No tenderness.  ?Abdominal:  ?   General: Abdomen is flat. Bowel sounds are normal. There is no distension.  ?   Palpations: Abdomen is soft. There is no mass.  ?   Tenderness: There is no abdominal tenderness. There is no guarding or rebound.  ?Musculoskeletal:     ?   General: No tenderness. Normal range of motion.  ?   Cervical back: Normal  range of motion and neck supple. No tenderness.  ?   Right lower leg: No edema.  ?   Left lower leg: No edema.  ?Lymphadenopathy:  ?   Cervical: No cervical adenopathy.  ?Skin: ?   General: Skin is warm and dry.  ?   Capillary Refill: Capillary refill takes less than 2 seconds.  ?   Findings: No rash.  ?Neurological:  ?   General: No focal deficit present.  ?   Mental Status: She is alert and oriented to person, place, and time. Mental status is at baseline.  ?Psychiatric:     ?  Mood and Affect: Mood normal.     ?   Behavior: Behavior normal. Behavior is cooperative.     ?   Thought Content: Thought content normal.     ?   Judgment: Judgment normal.  ?  ? ? ?Results for orders placed or performed in visit on 07/02/21  ?POCT glycosylated hemoglobin (Hb A1C)  ?Result Value Ref Range  ? Hemoglobin A1C 6.3 (A) 4.0 - 5.6 %  ? Est. average glucose Bld gHb Est-mCnc 134   ? ? Assessment & Plan  ?  ? ?1. Type 2 diabetes mellitus with both eyes affected by mild nonproliferative retinopathy without macular edema, without long-term current use of insulin ([place]) ?Diabetes well controlled with an A1c of 6.3 on metformin alone.  Follow-up in 4 months ?- POCT glycosylated hemoglobin (Hb A1C)--6.3 today. ? ?2. Essential hypertension ?Pressure elevated on amlodipine and alone.  Should be on ARB or ACE inhibitor.  Start losartan 50 mg daily ?- Comprehensive Metabolic Panel (CMET) ? ?3. Abnormal LFTs ? ?- Comprehensive Metabolic Panel (CMET) ? ?4. Diabetic retinopathy associated with controlled type 2 diabetes mellitus ([place]) ? ? ?5. Abnormal mammogram ? ? ?6. Tingling ?Diabetic neuropathy but check B12 and folate in the face of home going metformin and omeprazole use  ?- B12 and Folate Panel ? ?7. High risk medication use ? ?- B12 and Folate Panel ? ?8. Hypercholesteremia ?Simvastatin 20. ?- Lipid Profile ?- Comprehensive Metabolic Panel (CMET) ? ?9. History of abnormal mammogram ? ?- MM 3D SCREEN BREAST BILATERAL ?10.  Chronic foot  pain ?25-monthhandicap sticker is written.  Foot problem followed by podiatry.  She has had surgery for this in the past. ? ?Return in about 6 months (around 01/01/2022).  ?   ? ?I, RWilhemena Durie MD, have r

## 2021-07-02 ENCOUNTER — Ambulatory Visit (INDEPENDENT_AMBULATORY_CARE_PROVIDER_SITE_OTHER): Payer: Medicare HMO | Admitting: Family Medicine

## 2021-07-02 ENCOUNTER — Encounter: Payer: Self-pay | Admitting: Family Medicine

## 2021-07-02 VITALS — BP 185/73 | HR 71 | Temp 98.1°F | Resp 16 | Ht 63.0 in | Wt 154.0 lb

## 2021-07-02 DIAGNOSIS — Z87898 Personal history of other specified conditions: Secondary | ICD-10-CM

## 2021-07-02 DIAGNOSIS — R202 Paresthesia of skin: Secondary | ICD-10-CM

## 2021-07-02 DIAGNOSIS — R7989 Other specified abnormal findings of blood chemistry: Secondary | ICD-10-CM | POA: Diagnosis not present

## 2021-07-02 DIAGNOSIS — K219 Gastro-esophageal reflux disease without esophagitis: Secondary | ICD-10-CM

## 2021-07-02 DIAGNOSIS — E11319 Type 2 diabetes mellitus with unspecified diabetic retinopathy without macular edema: Secondary | ICD-10-CM | POA: Diagnosis not present

## 2021-07-02 DIAGNOSIS — I10 Essential (primary) hypertension: Secondary | ICD-10-CM | POA: Diagnosis not present

## 2021-07-02 DIAGNOSIS — E1142 Type 2 diabetes mellitus with diabetic polyneuropathy: Secondary | ICD-10-CM

## 2021-07-02 DIAGNOSIS — Z79899 Other long term (current) drug therapy: Secondary | ICD-10-CM | POA: Diagnosis not present

## 2021-07-02 DIAGNOSIS — E7800 Pure hypercholesterolemia, unspecified: Secondary | ICD-10-CM

## 2021-07-02 DIAGNOSIS — E113293 Type 2 diabetes mellitus with mild nonproliferative diabetic retinopathy without macular edema, bilateral: Secondary | ICD-10-CM

## 2021-07-02 DIAGNOSIS — R928 Other abnormal and inconclusive findings on diagnostic imaging of breast: Secondary | ICD-10-CM | POA: Diagnosis not present

## 2021-07-02 LAB — POCT GLYCOSYLATED HEMOGLOBIN (HGB A1C)
Est. average glucose Bld gHb Est-mCnc: 134
Hemoglobin A1C: 6.3 % — AB (ref 4.0–5.6)

## 2021-07-02 MED ORDER — LOSARTAN POTASSIUM 50 MG PO TABS: 50.0000 mg | ORAL_TABLET | Freq: Every day | ORAL | 3 refills | Status: DC

## 2021-07-03 LAB — COMPREHENSIVE METABOLIC PANEL
ALT: 27 IU/L (ref 0–32)
AST: 43 IU/L — ABNORMAL HIGH (ref 0–40)
Albumin/Globulin Ratio: 2.2 (ref 1.2–2.2)
Albumin: 4.9 g/dL — ABNORMAL HIGH (ref 3.8–4.8)
Alkaline Phosphatase: 93 IU/L (ref 44–121)
BUN/Creatinine Ratio: 18 (ref 12–28)
BUN: 12 mg/dL (ref 8–27)
Bilirubin Total: 1 mg/dL (ref 0.0–1.2)
CO2: 27 mmol/L (ref 20–29)
Calcium: 10 mg/dL (ref 8.7–10.3)
Chloride: 101 mmol/L (ref 96–106)
Creatinine, Ser: 0.66 mg/dL (ref 0.57–1.00)
Globulin, Total: 2.2 g/dL (ref 1.5–4.5)
Glucose: 121 mg/dL — ABNORMAL HIGH (ref 70–99)
Potassium: 4.4 mmol/L (ref 3.5–5.2)
Sodium: 144 mmol/L (ref 134–144)
Total Protein: 7.1 g/dL (ref 6.0–8.5)
eGFR: 95 mL/min/{1.73_m2} (ref >59)

## 2021-07-03 LAB — LIPID PANEL
Chol/HDL Ratio: 2.9 ratio (ref 0.0–4.4)
Cholesterol, Total: 169 mg/dL (ref 100–199)
HDL: 58 mg/dL (ref >39)
LDL Chol Calc (NIH): 88 mg/dL (ref 0–99)
Triglycerides: 129 mg/dL (ref 0–149)
VLDL Cholesterol Cal: 23 mg/dL (ref 5–40)

## 2021-07-03 LAB — B12 AND FOLATE PANEL
Folate: 7.5 ng/mL (ref >3.0)
Vitamin B-12: 307 pg/mL (ref 232–1245)

## 2021-07-29 ENCOUNTER — Telehealth: Payer: Self-pay | Admitting: Family Medicine

## 2021-07-29 NOTE — Telephone Encounter (Signed)
Pt needs a letter sent to her insurance company from Dr Rosanna Randy that states it is recommended pt get 2 mammograms a year. ? ?Pt states the drs at [place] advised pt she needs 2 a year due to being high risk for breast cancer. ?Pt states the letter needs to be both mailed and faxed. ? ?[employer] fax: [phone] ?Attn:  clinical intake team ? ?Address: [employer] ?[address] [id number] ?[place], [place] ?Attn:  clinical intake team ? ? ? ? ?

## 2021-08-13 DIAGNOSIS — D225 Melanocytic nevi of trunk: Secondary | ICD-10-CM | POA: Diagnosis not present

## 2021-08-13 DIAGNOSIS — D2262 Melanocytic nevi of left upper limb, including shoulder: Secondary | ICD-10-CM | POA: Diagnosis not present

## 2021-08-13 DIAGNOSIS — L821 Other seborrheic keratosis: Secondary | ICD-10-CM | POA: Diagnosis not present

## 2021-08-13 DIAGNOSIS — Z85828 Personal history of other malignant neoplasm of skin: Secondary | ICD-10-CM | POA: Diagnosis not present

## 2021-08-13 DIAGNOSIS — D2261 Melanocytic nevi of right upper limb, including shoulder: Secondary | ICD-10-CM | POA: Diagnosis not present

## 2021-08-26 ENCOUNTER — Other Ambulatory Visit: Payer: Self-pay | Admitting: Family Medicine

## 2021-08-26 DIAGNOSIS — E7800 Pure hypercholesterolemia, unspecified: Secondary | ICD-10-CM

## 2021-08-26 DIAGNOSIS — K219 Gastro-esophageal reflux disease without esophagitis: Secondary | ICD-10-CM

## 2021-08-26 DIAGNOSIS — E113293 Type 2 diabetes mellitus with mild nonproliferative diabetic retinopathy without macular edema, bilateral: Secondary | ICD-10-CM

## 2021-09-17 ENCOUNTER — Telehealth: Payer: Self-pay

## 2021-09-17 NOTE — Telephone Encounter (Signed)
Copied from [place] [id number]. Topic: General - Other >> Sep 17, 2021  4:35 PM Everette C wrote: Reason for CRM: The patient has called to follow up on their previous request for a letter needed by their insurance company   The patient shares that the letter was needed for coverage of two yearly mammograms for the patient   The patient was previously told that they were high risk and would need a Bi Annual mammogram   The patient shares that they requested the letter from their PCP roughly two months ago  The patient has called to check on the status of the letter and it's completion   Please contact further

## 2021-09-18 NOTE — Telephone Encounter (Signed)
Patient needs a letter of necessity in order to be approved for 2 mammograms a year. Please advise?

## 2021-09-18 NOTE — Telephone Encounter (Signed)
Sent message to Parke Poisson for advise.

## 2021-10-03 NOTE — Telephone Encounter (Signed)
Please advise? See message from 07/29/2021.

## 2021-10-03 NOTE — Telephone Encounter (Signed)
Pt called again for the letter for approval to have 2 mammograms a year that should have been sent to [employer]/ she was advised by [facility] provider that this is very important / please advise pt of letter status

## 2021-10-07 ENCOUNTER — Other Ambulatory Visit: Payer: Self-pay

## 2021-10-07 ENCOUNTER — Other Ambulatory Visit: Payer: Self-pay | Admitting: Family Medicine

## 2021-10-07 DIAGNOSIS — R928 Other abnormal and inconclusive findings on diagnostic imaging of breast: Secondary | ICD-10-CM

## 2021-10-07 NOTE — Telephone Encounter (Signed)
Order has been placed and patient has been advised.

## 2021-10-07 NOTE — Telephone Encounter (Signed)
OK to order that.

## 2021-10-23 ENCOUNTER — Ambulatory Visit
Admission: RE | Admit: 2021-10-23 | Discharge: 2021-10-23 | Disposition: A | Discharge status: Home / Self Care | Payer: Medicare HMO | Source: Ambulatory Visit | Attending: Family Medicine | Admitting: Family Medicine

## 2021-10-23 DIAGNOSIS — R928 Other abnormal and inconclusive findings on diagnostic imaging of breast: Secondary | ICD-10-CM

## 2021-11-01 ENCOUNTER — Other Ambulatory Visit: Payer: Self-pay | Admitting: Family Medicine

## 2021-11-01 DIAGNOSIS — I10 Essential (primary) hypertension: Secondary | ICD-10-CM

## 2021-11-01 NOTE — Telephone Encounter (Signed)
Requested Prescriptions  Pending Prescriptions Disp Refills  . amLODipine (NORVASC) 10 MG tablet [Pharmacy Med Name: AMLODIPINE BESYLATE 10 MG Tablet] 90 tablet 1    Sig: TAKE 1 TABLET EVERY DAY     Cardiovascular: Calcium Channel Blockers 2 Failed - 11/01/2021  2:53 AM      Failed - Last BP in normal range    BP Readings from Last 1 Encounters:  07/02/21 (!) 185/73         Passed - Last Heart Rate in normal range    Pulse Readings from Last 1 Encounters:  07/02/21 71         Passed - Valid encounter within last 6 months    Recent Outpatient Visits          4 months ago Type 2 diabetes mellitus with both eyes affected by mild nonproliferative retinopathy without macular edema, without long-term current use of insulin [facility])   [facility] [place] Jerrol Banana., MD   10 months ago Encounter for annual wellness exam in Medicare patient   [facility] Jerrol Banana., MD   3 years ago Initial Medicare annual wellness visit   [facility] [place], [place], [place]   5 years ago Annual physical exam   [facility] [place], [place]   6 years ago Annual physical exam   [facility] Margarita Rana, MD

## 2021-11-29 ENCOUNTER — Other Ambulatory Visit: Payer: Self-pay | Admitting: Family Medicine

## 2021-11-29 ENCOUNTER — Other Ambulatory Visit: Payer: Self-pay | Admitting: Orthopedic Surgery

## 2021-11-29 ENCOUNTER — Telehealth: Payer: Self-pay | Admitting: Family Medicine

## 2021-11-29 DIAGNOSIS — Z1231 Encounter for screening mammogram for malignant neoplasm of breast: Secondary | ICD-10-CM

## 2021-11-29 NOTE — Telephone Encounter (Signed)
Pt is calling to request if Dr. Alba Cory can place order for mammogram at [facility]. They were unable to find provider in the system. Cb- [phone]

## 2021-11-29 NOTE — Telephone Encounter (Signed)
Order for mammogram placed.   [name], MD  [facility]  [phone]

## 2022-01-08 NOTE — Progress Notes (Signed)
I,[name],acting as a scribe for [employer], MD.,have documented all relevant documentation on the behalf of Laurie Foster, MD,as directed by  Laurie Foster, MD while in the presence of Laurie Foster, MD.   Annual Wellness Visit     Patient: Laurie Watson, Female    DOB: 1951/02/11, 71 y.o.   MRN: [id number] Visit Date: 01/09/2022  Today's Provider: [name], MD   Chief Complaint  Patient presents with  . Annual Exam   Subjective    Laurie Watson is a 71 y.o. female who presents today for her Annual Wellness Visit. She reports consuming a  well balanced  diet. Home exercise routine includes walks daily. She generally feels well. She reports sleeping well. She does have additional problems to discuss today.  Patient states that she has mammogram scheduled for 1 PM today, 01/09/2022  Foot Joint pain Patient states that she follows with podiatry and has had surgery on her right foot in the past and is doing well with this She reports that over the last month and a half, she has noticed swelling intermittent painful episodes and on the left at the base of her great toe She states that she does not have history of gout flares but wonders if this is the cause of her pain She states that it is often sore during these episodes She has been using topical Biofreeze to help with these episodes as well as taking oral NSAIDs  Diabetes Patient states that she mostly follows a well-balanced diet and eats fresh vegetables from her garden and participates in regular physical activity She states that she often will indulge in sweets including candy and desserts like ice cream She is adherent to her regimen of metformin 1000 mg twice daily She denies any symptoms of hypoglycemia She states that she often has fasting blood glucoses in the 140s She is expressing interest in adding an agent to help with glucose control and  with weight loss benefit    HPI    Medications: Outpatient Medications Prior to Visit  Medication Sig  . amLODipine (NORVASC) 10 MG tablet TAKE 1 TABLET EVERY DAY  . aspirin 81 MG chewable tablet Chew 1 tablet by mouth daily.  . cholecalciferol (VITAMIN D) 1000 UNITS tablet Take 1 tablet by mouth daily.  . Cinnamon 500 MG capsule Take 500 mg by mouth daily.  . Coenzyme Q10 (COQ10 PO) Take by mouth.  . COLLAGEN-VITAMIN C-BIOTIN PO Take by mouth. 6g per serving  . docusate sodium (COLACE) 100 MG capsule Take 1 capsule by mouth daily.  Marland Kitchen glucose blood test strip To check blood sugar daily  . losartan (COZAAR) 50 MG tablet Take 1 tablet (50 mg total) by mouth daily.  . metFORMIN (GLUCOPHAGE) 1000 MG tablet TAKE 1 TABLET TWICE DAILY WITH MEALS  . Microlet Lancets MISC Use once daily to check blood sugar  . Omega-3 Fatty Acids (FISH OIL BURP-LESS) 1000 MG CAPS Take 1 capsule by mouth daily.  Marland Kitchen omeprazole (PRILOSEC) 20 MG capsule TAKE 1 CAPSULE EVERY DAY  . Probiotic CAPS Take 1 capsule by mouth daily.  . simvastatin (ZOCOR) 20 MG tablet TAKE 1 TABLET EVERY DAY   No facility-administered medications prior to visit.    Allergies  Allergen Reactions  . Codeine Nausea Only  . Penicillins Rash    Patient Care Team: Laurie Foster, MD as PCP - General (Family Medicine)  Review of Systems      Objective    Vitals:  BP (!) 145/77 (BP Location: Right Arm, Patient Position: Sitting, Cuff Size: Normal)   Pulse 69   Temp 97.7 F (36.5 C) (Oral)   Resp 16   Wt 161 lb 1.6 oz (73.1 kg)   BMI 28.54 kg/m     Physical Exam Vitals reviewed.  Constitutional:      General: She is not in acute distress.    Appearance: Normal appearance. She is not ill-appearing, toxic-appearing or diaphoretic.  HENT:     Head: Normocephalic and atraumatic.     Right Ear: Tympanic membrane and external ear normal. There is no impacted cerumen.     Left Ear: Tympanic membrane and external  ear normal. There is no impacted cerumen.     Nose: Nose normal.     Mouth/Throat:     Pharynx: Oropharynx is clear.  Eyes:     General: No scleral icterus.    Extraocular Movements: Extraocular movements intact.     Conjunctiva/sclera: Conjunctivae normal.     Pupils: Pupils are equal, round, and reactive to light.  Cardiovascular:     Rate and Rhythm: Normal rate and regular rhythm.     Pulses: Normal pulses.          Dorsalis pedis pulses are 2+ on the right side and 2+ on the left side.       Posterior tibial pulses are 2+ on the right side and 2+ on the left side.     Heart sounds: Normal heart sounds. No murmur heard.    No friction rub. No gallop.  Pulmonary:     Effort: Pulmonary effort is normal. No respiratory distress.     Breath sounds: Normal breath sounds. No wheezing, rhonchi or rales.  Abdominal:     General: Bowel sounds are normal. There is no distension.     Palpations: Abdomen is soft. There is no mass.     Tenderness: There is no abdominal tenderness. There is no guarding.  Musculoskeletal:        General: No deformity.     Cervical back: Normal range of motion and neck supple. No rigidity.     Right lower leg: No edema.     Left lower leg: No edema.     Left foot: Prominent metatarsal heads present.  Feet:     Right foot:     Protective Sensation: 6 sites tested.  6 sites sensed.     Skin integrity: Skin integrity normal.     Toenail Condition: Right toenails are normal.     Left foot:     Protective Sensation: 6 sites tested.  6 sites sensed.     Skin integrity: Skin integrity normal.     Toenail Condition: Left toenails are normal.     Comments: Prominent first metatarsal on bilateral feet, left >right  Lymphadenopathy:     Cervical: No cervical adenopathy.  Skin:    General: Skin is warm.     Capillary Refill: Capillary refill takes less than 2 seconds.     Findings: No erythema or rash.  Neurological:     General: No focal deficit present.      Mental Status: She is alert and oriented to person, place, and time.     Motor: No weakness.     Gait: Gait normal.  Psychiatric:        Mood and Affect: Mood normal.        Behavior: Behavior normal.     Most recent functional status assessment:  01/09/2022   10:10 AM  In your present state of health, do you have any difficulty performing the following activities:  Hearing? 0  Vision? 0  Difficulty concentrating or making decisions? 0  Walking or climbing stairs? 0  Dressing or bathing? 0  Doing errands, shopping? 0   Most recent fall risk assessment:    01/09/2022   10:00 AM  [place] in the past year? 0  Number falls in past yr: 0  Injury with Fall? 0  Risk for fall due to : No Fall Risks    Most recent depression screenings:    01/09/2022   10:10 AM 07/02/2021    9:47 AM  PHQ 2/9 Scores  PHQ - 2 Score 0 0  PHQ- 9 Score  0   Most recent cognitive screening:     No data to display         Most recent Audit-C alcohol use screening    07/02/2021    9:47 AM  Alcohol Use Disorder Test (AUDIT)  1. How often do you have a drink containing alcohol? 1  2. How many drinks containing alcohol do you have on a typical day when you are drinking? 0  3. How often do you have six or more drinks on one occasion? 0  AUDIT-C Score 1   A score of 3 or more in women, and 4 or more in men indicates increased risk for alcohol abuse, EXCEPT if all of the points are from question 1   No results found for any visits on 01/09/22.  Assessment & Plan     Annual wellness visit done today including the all of the following: Reviewed patient's Family Medical History Reviewed and updated list of patient's medical providers Assessment of cognitive impairment was done Assessed patient's functional ability Established a written schedule for health screening [place] Completed and Reviewed  Exercise Activities and Dietary recommendations  Goals      . Exercise 150 minutes per week (moderate activity)        Immunization History  Administered Date(s) Administered  . Fluad Quad(high Dose 65+) 12/15/2018, 12/26/2019, 01/01/2021, 01/09/2022  . Influenza,inj,Quad PF,6+ Mos 03/05/2015, 03/05/2016  . Influenza-Unspecified 11/11/2017  . PFIZER(Purple Top)SARS-COV-2 Vaccination 06/29/2019, 07/20/2019, 03/26/2020  . Pneumococcal Conjugate-13 12/15/2018  . Pneumococcal Polysaccharide-23 02/11/2012, 11/11/2017  . Td 04/23/1999  . Tdap 10/17/2010  . Zoster Recombinat (Shingrix) 01/10/2019, 03/21/2019  . Zoster, Live 04/05/2010    Health Maintenance  Topic Date Due  . COVID-19 Vaccine (4 - Pfizer risk series) 05/21/2020  . OPHTHALMOLOGY EXAM  10/06/2020  . TETANUS/TDAP  10/16/2020  . Diabetic kidney evaluation - Urine ACR  12/25/2020  . HEMOGLOBIN A1C  01/01/2022  . Diabetic kidney evaluation - GFR measurement  07/03/2022  . MAMMOGRAM  01/02/2023  . FOOT EXAM  01/10/2023  . COLONOSCOPY (Pts 45-74yr Insurance coverage will need to be confirmed)  01/19/2028  . Pneumonia Vaccine 69 Years old  Completed  . INFLUENZA VACCINE  Completed  . DEXA SCAN  Completed  . Hepatitis C Screening  Completed  . Zoster Vaccines- Shingrix  Completed  . HPV VACCINES  Aged Out     Discussed health benefits of physical activity, and encouraged her to engage in regular exercise appropriate for her age and condition.    Problem List Items Addressed This Visit       Cardiovascular and Mediastinum   Essential hypertension    Chronic, uncontrolled  Recently had BP meds  prior to appt  Recheck improved to 145/77, asymptomatic  Recommended taking BP meds including amlodipine '10mg'$ , losartan '50mg'$   in the AM and measuring BP 1-2 hours after meds  CMP collected today       Relevant Orders   Comprehensive metabolic panel     Endocrine   T2DM (type 2 diabetes mellitus) ([place]) - Primary    Controlled  She will continue metformin '1000mg'$  twice daily   We will add 2.'5mg'$  mounjaro weekly  a1c repeated today  Foot exam completed today  Recommended for DM eye exam, patient reports this visit is scheduled  Urine microalbumin collected today  F/u for med change in 4-6 weeks        Relevant Medications   tirzepatide (MOUNJARO) 2.5 MG/0.5ML Pen   Other Relevant Orders   Comprehensive metabolic panel   Lipid panel   Hemoglobin A1c   Urine microalbumin-creatinine with uACR   Diabetic retinopathy associated with controlled type 2 diabetes mellitus ([place])    Controlled  She will continue metformin '1000mg'$  twice daily  We will add 2.'5mg'$  mounjaro weekly  a1c repeated today  Foot exam completed today  Recommended for DM eye exam, patient reports this visit is scheduled  Urine microalbumin collected today  F/u for med change in 4-6 weeks       Relevant Medications   tirzepatide (MOUNJARO) 2.5 MG/0.5ML Pen     Other   Hypercholesteremia    Chronic, controlled  Lipid panel today  She will continue simvastatin '20mg'$  daily        Relevant Orders   Lipid panel   Vitamin D deficiency    Chronic On supplementation with cholecalciferol 1000units daily  She will continue supplement  Check vitamin d today        Relevant Orders   VITAMIN D 25 Hydroxy (Vit-D Deficiency, Fractures)   Arthralgia of left foot    New problem, intermittent  No pain today but describes intermittent episodes of first metatarsal pain and swelling sometimes with erythema consistent with gout  Will check uric acid levels  If this is normal, recommended scheduling follow up with her podiatrist  She can continue topical therapies for pain management during acute episodes unless diagnosis of gout and she warrants treatment in the future      Relevant Orders   Uric acid   Other Visit Diagnoses     Need for influenza vaccination       Relevant Orders   Flu Vaccine QUAD High Dose(Fluad) (Completed)        Return in about 6 weeks (around 02/20/2022) for  Diabetes med .     I, Laurie Foster, MD, have reviewed all documentation for this visit. The documentation on 01/09/22 for the exam, diagnosis, procedures, and orders are all accurate and complete.    Laurie Foster, MD  [facility] [phone] (phone) [phone] (fax)  [place]

## 2022-01-09 ENCOUNTER — Ambulatory Visit (INDEPENDENT_AMBULATORY_CARE_PROVIDER_SITE_OTHER): Payer: Medicare HMO | Admitting: Family Medicine

## 2022-01-09 ENCOUNTER — Encounter: Payer: Medicare HMO | Admitting: Family Medicine

## 2022-01-09 ENCOUNTER — Encounter: Payer: Self-pay | Admitting: Family Medicine

## 2022-01-09 ENCOUNTER — Ambulatory Visit
Admission: RE | Admit: 2022-01-09 | Discharge: 2022-01-09 | Disposition: A | Discharge status: Home / Self Care | Payer: Medicare HMO | Source: Ambulatory Visit | Attending: Family Medicine | Admitting: Family Medicine

## 2022-01-09 VITALS — BP 145/77 | HR 69 | Temp 97.7°F | Resp 16 | Wt 161.1 lb

## 2022-01-09 DIAGNOSIS — E113293 Type 2 diabetes mellitus with mild nonproliferative diabetic retinopathy without macular edema, bilateral: Secondary | ICD-10-CM | POA: Diagnosis not present

## 2022-01-09 DIAGNOSIS — Z23 Encounter for immunization: Secondary | ICD-10-CM | POA: Diagnosis not present

## 2022-01-09 DIAGNOSIS — E7800 Pure hypercholesterolemia, unspecified: Secondary | ICD-10-CM

## 2022-01-09 DIAGNOSIS — I152 Hypertension secondary to endocrine disorders: Secondary | ICD-10-CM | POA: Insufficient documentation

## 2022-01-09 DIAGNOSIS — Z0000 Encounter for general adult medical examination without abnormal findings: Secondary | ICD-10-CM | POA: Diagnosis not present

## 2022-01-09 DIAGNOSIS — E559 Vitamin D deficiency, unspecified: Secondary | ICD-10-CM | POA: Diagnosis not present

## 2022-01-09 DIAGNOSIS — Z1231 Encounter for screening mammogram for malignant neoplasm of breast: Secondary | ICD-10-CM | POA: Insufficient documentation

## 2022-01-09 DIAGNOSIS — M25572 Pain in left ankle and joints of left foot: Secondary | ICD-10-CM | POA: Insufficient documentation

## 2022-01-09 DIAGNOSIS — I10 Essential (primary) hypertension: Secondary | ICD-10-CM | POA: Diagnosis not present

## 2022-01-09 DIAGNOSIS — E11319 Type 2 diabetes mellitus with unspecified diabetic retinopathy without macular edema: Secondary | ICD-10-CM

## 2022-01-09 MED ORDER — TIRZEPATIDE 2.5 MG/0.5ML ~~LOC~~ SOAJ: SUBCUTANEOUS | 2 refills | Status: DC

## 2022-01-09 NOTE — Assessment & Plan Note (Signed)
New problem, intermittent  No pain today but describes intermittent episodes of first metatarsal pain and swelling sometimes with erythema consistent with gout  Will check uric acid levels  If this is normal, recommended scheduling follow up with her podiatrist  She can continue topical therapies for pain management during acute episodes unless diagnosis of gout and she warrants treatment in the future

## 2022-01-09 NOTE — Patient Instructions (Addendum)
It was a pleasure to see you today!  Thank you for choosing [facility] for your primary care.   Laurie Watson was seen for annual physical.   I have prescribed Mounjaro for you to start.  Please take 2.5 mg once per week for 4 weeks and then increase to 5 mg once per week.    Please plan to follow-up with me in 4-6 weeks  We will follow-up with blood work once results are available.  If levels for gout are negative, I recommend following up with your podiatrist for the intermittent pain in your foot.  Please continue to monitor your blood pressure 1 to 2 hours after taking blood pressure medications.    To keep you healthy, please keep in mind the following health maintenance items that you are due for:     Tetanus vaccine  COVID vaccine  Diabetes Eye exam   Please follow up in 4 - 6 weeks for diabetes   Best Wishes,   Dr. Quentin Cornwall     Health Maintenance After Age 62 After age 52, you are at a higher risk for certain long-term diseases and infections as well as injuries from falls. Falls are a major cause of broken bones and head injuries in people who are older than age 97. Getting regular preventive care can help to keep you healthy and well. Preventive care includes getting regular testing and making lifestyle changes as recommended by your health care provider. Talk with your health care provider about: Which screenings and tests you should have. A screening is a test that checks for a disease when you have no symptoms. A diet and exercise plan that is right for you. What should I know about screenings and tests to prevent falls? Screening and testing are the best ways to find a health problem early. Early diagnosis and treatment give you the best chance of managing medical conditions that are common after age 71. Certain conditions and lifestyle choices may make you more likely to have a fall. Your health care provider may recommend: Regular vision  checks. Poor vision and conditions such as cataracts can make you more likely to have a fall. If you wear glasses, make sure to get your prescription updated if your vision changes. Medicine review. Work with your health care provider to regularly review all of the medicines you are taking, including over-the-counter medicines. Ask your health care provider about any side effects that may make you more likely to have a fall. Tell your health care provider if any medicines that you take make you feel dizzy or sleepy. Strength and balance checks. Your health care provider may recommend certain tests to check your strength and balance while standing, walking, or changing positions. Foot health exam. Foot pain and numbness, as well as not wearing proper footwear, can make you more likely to have a fall. Screenings, including: Osteoporosis screening. Osteoporosis is a condition that causes the bones to get weaker and break more easily. Blood pressure screening. Blood pressure changes and medicines to control blood pressure can make you feel dizzy. Depression screening. You may be more likely to have a fall if you have a fear of falling, feel depressed, or feel unable to do activities that you used to do. Alcohol use screening. Using too much alcohol can affect your balance and may make you more likely to have a fall. Follow these instructions at home: Lifestyle Do not drink alcohol if: Your health care provider tells you not  to drink. If you drink alcohol: Limit how much you have to: 0-1 drink a day for women. 0-2 drinks a day for men. Know how much alcohol is in your drink. In the [place] one drink equals one 12 oz bottle of beer (355 mL), one 5 oz glass of wine (148 mL), or one 1 oz glass of hard liquor (44 mL). Do not use any products that contain nicotine or tobacco. These products include cigarettes, chewing tobacco, and vaping devices, such as e-cigarettes. If you need help quitting, ask your  health care provider. Activity  Follow a regular exercise program to stay fit. This will help you maintain your balance. Ask your health care provider what types of exercise are appropriate for you. If you need a cane or walker, use it as recommended by your health care provider. Wear supportive shoes that have nonskid soles. Safety  Remove any tripping hazards, such as rugs, cords, and clutter. Install safety equipment such as grab bars in bathrooms and safety rails on stairs. Keep rooms and walkways well-lit. General instructions Talk with your health care provider about your risks for falling. Tell your health care provider if: You fall. Be sure to tell your health care provider about all falls, even ones that seem minor. You feel dizzy, tiredness (fatigue), or off-balance. Take over-the-counter and prescription medicines only as told by your health care provider. These include supplements. Eat a healthy diet and maintain a healthy weight. A healthy diet includes low-fat dairy products, low-fat (lean) meats, and fiber from whole grains, beans, and lots of fruits and vegetables. Stay current with your vaccines. Schedule regular health, dental, and eye exams. Summary Having a healthy lifestyle and getting preventive care can help to protect your health and wellness after age 50. Screening and testing are the best way to find a health problem early and help you avoid having a fall. Early diagnosis and treatment give you the best chance for managing medical conditions that are more common for people who are older than age 57. Falls are a major cause of broken bones and head injuries in people who are older than age 16. Take precautions to prevent a fall at home. Work with your health care provider to learn what changes you can make to improve your health and wellness and to prevent falls. This information is not intended to replace advice given to you by your health care provider. Make sure  you discuss any questions you have with your health care provider. Document Revised: 07/30/2020 Document Reviewed: 07/30/2020 Elsevier Patient Education  [place].

## 2022-01-09 NOTE — Assessment & Plan Note (Signed)
Chronic On supplementation with cholecalciferol 1000units daily  She will continue supplement  Check vitamin d today

## 2022-01-09 NOTE — Assessment & Plan Note (Signed)
Chronic, uncontrolled  Recently had BP meds prior to appt  Recheck improved to 145/77, asymptomatic  Recommended taking BP meds including amlodipine '10mg'$ , losartan '50mg'$   in the AM and measuring BP 1-2 hours after meds  CMP collected today

## 2022-01-09 NOTE — Assessment & Plan Note (Signed)
Controlled  She will continue metformin '1000mg'$  twice daily  We will add 2.'5mg'$  mounjaro weekly  a1c repeated today  Foot exam completed today  Recommended for DM eye exam, patient reports this visit is scheduled  Urine microalbumin collected today  F/u for med change in 4-6 weeks

## 2022-01-09 NOTE — Assessment & Plan Note (Signed)
Chronic, controlled  Lipid panel today  She will continue simvastatin '20mg'$  daily

## 2022-01-09 NOTE — Assessment & Plan Note (Addendum)
Controlled  She will continue metformin '1000mg'$  twice daily  We will add 2.'5mg'$  mounjaro weekly  a1c repeated today  Foot exam completed today  Recommended for DM eye exam, patient reports this visit is scheduled  Urine microalbumin collected today  F/u for med change in 4-6 weeks

## 2022-01-10 LAB — LIPID PANEL
Chol/HDL Ratio: 3 ratio (ref 0.0–4.4)
Cholesterol, Total: 163 mg/dL (ref 100–199)
HDL: 54 mg/dL (ref >39)
LDL Chol Calc (NIH): 86 mg/dL (ref 0–99)
Triglycerides: 132 mg/dL (ref 0–149)
VLDL Cholesterol Cal: 23 mg/dL (ref 5–40)

## 2022-01-10 LAB — COMPREHENSIVE METABOLIC PANEL
ALT: 32 IU/L (ref 0–32)
AST: 39 IU/L (ref 0–40)
Albumin/Globulin Ratio: 2.2 (ref 1.2–2.2)
Albumin: 4.8 g/dL (ref 3.9–4.9)
Alkaline Phosphatase: 82 IU/L (ref 44–121)
BUN/Creatinine Ratio: 19 (ref 12–28)
BUN: 12 mg/dL (ref 8–27)
Bilirubin Total: 0.8 mg/dL (ref 0.0–1.2)
CO2: 22 mmol/L (ref 20–29)
Calcium: 9.6 mg/dL (ref 8.7–10.3)
Chloride: 101 mmol/L (ref 96–106)
Creatinine, Ser: 0.62 mg/dL (ref 0.57–1.00)
Globulin, Total: 2.2 g/dL (ref 1.5–4.5)
Glucose: 115 mg/dL — ABNORMAL HIGH (ref 70–99)
Potassium: 4.3 mmol/L (ref 3.5–5.2)
Sodium: 138 mmol/L (ref 134–144)
Total Protein: 7 g/dL (ref 6.0–8.5)
eGFR: 96 mL/min/{1.73_m2} (ref >59)

## 2022-01-10 LAB — HEMOGLOBIN A1C
Est. average glucose Bld gHb Est-mCnc: 151 mg/dL
Hgb A1c MFr Bld: 6.9 % — ABNORMAL HIGH (ref 4.8–5.6)

## 2022-01-10 LAB — MICROALBUMIN / CREATININE URINE RATIO
Creatinine, Urine: 64.1 mg/dL
Microalb/Creat Ratio: 39 mg/g creat — ABNORMAL HIGH (ref 0–29)
Microalbumin, Urine: 25 ug/mL

## 2022-01-10 LAB — URIC ACID: Uric Acid: 7.1 mg/dL (ref 3.0–7.2)

## 2022-01-10 LAB — VITAMIN D 25 HYDROXY (VIT D DEFICIENCY, FRACTURES): Vit D, 25-Hydroxy: 49.7 ng/mL (ref 30.0–100.0)

## 2022-01-13 ENCOUNTER — Encounter: Payer: Self-pay | Admitting: Family Medicine

## 2022-01-15 ENCOUNTER — Telehealth: Payer: Self-pay | Admitting: Family Medicine

## 2022-01-15 NOTE — Telephone Encounter (Signed)
The patient called in stating [place] the pharmacy through [facility] her insurance has questions about her tirzepatide [facility]) 2.5 MG/0.5ML Pen prescription. She states she thinks it maybe needs a prior authorization and is not sure how long that takes. Please assist patient further

## 2022-01-16 ENCOUNTER — Telehealth: Payer: Self-pay

## 2022-01-16 DIAGNOSIS — E113293 Type 2 diabetes mellitus with mild nonproliferative diabetic retinopathy without macular edema, bilateral: Secondary | ICD-10-CM

## 2022-01-16 MED ORDER — TIRZEPATIDE 2.5 MG/0.5ML ~~LOC~~ SOAJ: SUBCUTANEOUS | 0 refills | Status: DC

## 2022-01-16 NOTE — Telephone Encounter (Signed)
A Pa is not needed and new prescription was send in. Per pharmacist the old prescription was cancelled

## 2022-01-16 NOTE — Telephone Encounter (Signed)
[place] and per pharmacist they cancelled the prescription because they didn't received a response in a timely matter. Prescription was send in.

## 2022-01-30 DIAGNOSIS — H26102 Unspecified traumatic cataract, left eye: Secondary | ICD-10-CM | POA: Diagnosis not present

## 2022-01-30 DIAGNOSIS — E113293 Type 2 diabetes mellitus with mild nonproliferative diabetic retinopathy without macular edema, bilateral: Secondary | ICD-10-CM | POA: Diagnosis not present

## 2022-01-30 DIAGNOSIS — H21532 Iridodialysis, left eye: Secondary | ICD-10-CM | POA: Diagnosis not present

## 2022-01-30 DIAGNOSIS — H524 Presbyopia: Secondary | ICD-10-CM | POA: Diagnosis not present

## 2022-01-30 DIAGNOSIS — H52211 Irregular astigmatism, right eye: Secondary | ICD-10-CM | POA: Diagnosis not present

## 2022-02-07 ENCOUNTER — Other Ambulatory Visit: Payer: Self-pay | Admitting: Family Medicine

## 2022-02-07 DIAGNOSIS — E113293 Type 2 diabetes mellitus with mild nonproliferative diabetic retinopathy without macular edema, bilateral: Secondary | ICD-10-CM

## 2022-02-07 MED ORDER — GLUCOSE BLOOD VI STRP: ORAL_STRIP | 3 refills | Status: AC

## 2022-02-07 MED ORDER — MICROLET LANCETS MISC: 3 refills | Status: AC

## 2022-02-10 ENCOUNTER — Encounter: Payer: Self-pay | Admitting: Family Medicine

## 2022-02-10 ENCOUNTER — Ambulatory Visit (INDEPENDENT_AMBULATORY_CARE_PROVIDER_SITE_OTHER): Payer: Medicare HMO | Admitting: Family Medicine

## 2022-02-10 VITALS — BP 123/57 | HR 84 | Temp 98.0°F | Resp 16 | Wt 155.6 lb

## 2022-02-10 DIAGNOSIS — E11319 Type 2 diabetes mellitus with unspecified diabetic retinopathy without macular edema: Secondary | ICD-10-CM

## 2022-02-10 DIAGNOSIS — M1A9XX0 Chronic gout, unspecified, without tophus (tophi): Secondary | ICD-10-CM | POA: Diagnosis not present

## 2022-02-10 DIAGNOSIS — I10 Essential (primary) hypertension: Secondary | ICD-10-CM | POA: Diagnosis not present

## 2022-02-10 DIAGNOSIS — K219 Gastro-esophageal reflux disease without esophagitis: Secondary | ICD-10-CM | POA: Diagnosis not present

## 2022-02-10 DIAGNOSIS — E7800 Pure hypercholesterolemia, unspecified: Secondary | ICD-10-CM | POA: Diagnosis not present

## 2022-02-10 DIAGNOSIS — E113293 Type 2 diabetes mellitus with mild nonproliferative diabetic retinopathy without macular edema, bilateral: Secondary | ICD-10-CM

## 2022-02-10 MED ORDER — AMLODIPINE BESYLATE 10 MG PO TABS: 10.0000 mg | ORAL_TABLET | Freq: Every day | ORAL | 1 refills | Status: DC

## 2022-02-10 MED ORDER — TIRZEPATIDE 5 MG/0.5ML ~~LOC~~ SOAJ: 5.0000 mg | SUBCUTANEOUS | 1 refills | Status: DC

## 2022-02-10 MED ORDER — METHYLPREDNISOLONE 4 MG PO TBPK: ORAL_TABLET | ORAL | 0 refills | Status: AC

## 2022-02-10 MED ORDER — COLCHICINE 0.6 MG PO TABS: ORAL_TABLET | ORAL | 1 refills | Status: DC

## 2022-02-10 MED ORDER — SIMVASTATIN 20 MG PO TABS: 20.0000 mg | ORAL_TABLET | Freq: Every day | ORAL | 1 refills | Status: DC

## 2022-02-10 MED ORDER — OMEPRAZOLE 20 MG PO CPDR: 20.0000 mg | DELAYED_RELEASE_CAPSULE | Freq: Every day | ORAL | 1 refills | Status: DC

## 2022-02-10 MED ORDER — METFORMIN HCL 1000 MG PO TABS: 1000.0000 mg | ORAL_TABLET | Freq: Two times a day (BID) | ORAL | 1 refills | Status: DC

## 2022-02-10 NOTE — Assessment & Plan Note (Signed)
Tolerating mounjaro well  Will increase to '5mg'$  weekly, new prescription filled for 49m with refills  She will follow up in April 2024

## 2022-02-10 NOTE — Progress Notes (Signed)
I,Laurie Watson,acting as a scribe for [employer], MD.,have documented all relevant documentation on the behalf of Laurie Foster, MD,as directed by  Laurie Foster, MD while in the presence of Laurie Foster, MD.   Established patient visit   Patient: Laurie Watson   DOB: 1950-06-15   71 y.o. Female  MRN: [id number] Visit Date: 02/10/2022  Today's healthcare provider: [name], MD   Chief Complaint  Patient presents with   Follow-Up DM   Subjective    HPI   Diabetes Mellitus Type II, Follow-up  Lab Results  Component Value Date   HGBA1C 6.9 (H) 01/09/2022   HGBA1C 6.3 (A) 07/02/2021   HGBA1C 7.0 (H) 01/01/2021   Wt Readings from Last 3 Encounters:  02/10/22 155 lb 9.6 oz (70.6 kg)  01/09/22 161 lb 1.6 oz (73.1 kg)  07/02/21 154 lb (69.9 kg)   Last seen for diabetes 4-6 weeks ago.  Management since then includes continue metformin 1027m twice daily  We will add 2.540mmounjaro weekly . She reports excellent compliance with treatment. She is not having side effects.   Symptoms: No fatigue No foot ulcerations  No appetite changes No nausea  No paresthesia of the feet  No polydipsia  No polyuria No visual disturbances   No vomiting     Home blood sugar records: fasting range: 110's -130's  Episodes of hypoglycemia? No    Pertinent Labs: Lab Results  Component Value Date   CHOL 163 01/09/2022   HDL 54 01/09/2022   LDLCALC 86 01/09/2022   TRIG 132 01/09/2022   CHOLHDL 3.0 01/09/2022   Lab Results  Component Value Date   NA 138 01/09/2022   K 4.3 01/09/2022   CREATININE 0.62 01/09/2022   EGFR 96 01/09/2022   MICROALBUR 20 12/26/2019   LABMICR 25.0 01/09/2022     --------------------------------------------------------------------------------------------------- Hypertensions: patient BP reading at home have been 140-128/60-today reading was 128/60.  Gout: Patient reports that she had  gout flare last week.  Medications: Outpatient Medications Prior to Visit  Medication Sig   aspirin 81 MG chewable tablet Chew 1 tablet by mouth daily.   cholecalciferol (VITAMIN D) 1000 UNITS tablet Take 1 tablet by mouth daily.   Cinnamon 500 MG capsule Take 500 mg by mouth daily.   Coenzyme Q10 (COQ10 PO) Take by mouth.   COLLAGEN-VITAMIN C-BIOTIN PO Take by mouth. 6g per serving   docusate sodium (COLACE) 100 MG capsule Take 1 capsule by mouth daily.   glucose blood test strip To check blood sugar daily   losartan (COZAAR) 50 MG tablet Take 1 tablet (50 mg total) by mouth daily.   Microlet Lancets MISC Use once daily to check blood sugar   Omega-3 Fatty Acids (FISH OIL BURP-LESS) 1000 MG CAPS Take 1 capsule by mouth daily.   Probiotic CAPS Take 1 capsule by mouth daily.   [DISCONTINUED] amLODipine (NORVASC) 10 MG tablet TAKE 1 TABLET EVERY DAY   [DISCONTINUED] metFORMIN (GLUCOPHAGE) 1000 MG tablet TAKE 1 TABLET TWICE DAILY WITH MEALS   [DISCONTINUED] omeprazole (PRILOSEC) 20 MG capsule TAKE 1 CAPSULE EVERY DAY   [DISCONTINUED] simvastatin (ZOCOR) 20 MG tablet TAKE 1 TABLET EVERY DAY   [DISCONTINUED] tirzepatide (MOUNJARO) 2.5 MG/0.5ML Pen Inject 2.69m18mose once weekly for 4 weeks then increase to 69mg1mce weekly   No facility-administered medications prior to visit.    Review of Systems     Objective    BP (!) 123/57 (BP Location: Right Arm, Patient Position:  Sitting, Cuff Size: Large)   Pulse 84   Temp 98 F (36.7 C) (Oral)   Resp 16   Wt 155 lb 9.6 oz (70.6 kg)   BMI 27.56 kg/m    Physical Exam Vitals reviewed.  Constitutional:      General: She is not in acute distress.    Appearance: Normal appearance. She is not ill-appearing, toxic-appearing or diaphoretic.  Eyes:     Conjunctiva/sclera: Conjunctivae normal.  Cardiovascular:     Rate and Rhythm: Normal rate and regular rhythm.     Pulses: Normal pulses.          Dorsalis pedis pulses are 2+ on the left  side.       Posterior tibial pulses are 2+ on the left side.     Heart sounds: Normal heart sounds. No murmur heard.    No friction rub. No gallop.  Pulmonary:     Effort: Pulmonary effort is normal. No respiratory distress.     Breath sounds: Normal breath sounds. No stridor. No wheezing, rhonchi or rales.  Abdominal:     General: Bowel sounds are normal. There is no distension.     Palpations: Abdomen is soft.     Tenderness: There is no abdominal tenderness.  Musculoskeletal:     Right lower leg: No edema.     Left lower leg: No edema.       Feet:  Feet:     Left foot:     Skin integrity: Erythema present. No ulcer, blister, skin breakdown or warmth.  Skin:    Findings: No erythema or rash.  Neurological:     Mental Status: She is alert and oriented to person, place, and time.     No results found for any visits on 02/10/22.  Assessment & Plan     Problem List Items Addressed This Visit       Cardiovascular and Mediastinum   Essential hypertension   Relevant Medications   simvastatin (ZOCOR) 20 MG tablet   amLODipine (NORVASC) 10 MG tablet     Digestive   Acid reflux   Relevant Medications   omeprazole (PRILOSEC) 20 MG capsule     Endocrine   T2DM (type 2 diabetes mellitus) ([place])   Relevant Medications   tirzepatide (MOUNJARO) 5 MG/0.5ML Pen   simvastatin (ZOCOR) 20 MG tablet   metFORMIN (GLUCOPHAGE) 1000 MG tablet   Diabetic retinopathy associated with controlled type 2 diabetes mellitus ([place]) - Primary    Tolerating mounjaro well  Will increase to 39m weekly, new prescription filled for 690mwith refills  She will follow up in April 2024       Relevant Medications   tirzepatide (MOUNJARO) 5 MG/0.5ML Pen   simvastatin (ZOCOR) 20 MG tablet   metFORMIN (GLUCOPHAGE) 1000 MG tablet     Musculoskeletal and Integument   Chronic gout involving toe of left foot without tophus    Recent gout flare for 1 week pain reportedly waxing and waning  Reports decreased  swelling  Will plan to treat with methyl-prednisolone steroid pack  For 5 day course Will also prescribe colchicine for future flares (patient will take 2 tabs (1.56m37mat first sign of flare followed by 0.6mg97mter 1 hour and then 1-2 tabs daily as needed until symptom resolution  Patient will be in [place] several weeks with her daugther recovering from surgery and requested medication supplies to last through the stay out of town       Relevant Medications  colchicine 0.6 MG tablet   methylPREDNISolone (MEDROL DOSEPAK) 4 MG TBPK tablet     Other   Hypercholesteremia   Relevant Medications   simvastatin (ZOCOR) 20 MG tablet   amLODipine (NORVASC) 10 MG tablet     Return in about 6 months (around 08/11/2022) for DM.      I, Laurie Foster, MD, have reviewed all documentation for this visit.  Portions of this information were initially documented by the CMA and reviewed by me for thoroughness and accuracy.      Laurie Foster, MD  [facility] [phone] (phone) [phone] (fax)  [place]

## 2022-02-10 NOTE — Assessment & Plan Note (Signed)
Recent gout flare for 1 week pain reportedly waxing and waning  Reports decreased swelling  Will plan to treat with methyl-prednisolone steroid pack  For 5 day course Will also prescribe colchicine for future flares (patient will take 2 tabs (1.'2mg'$ ) at first sign of flare followed by 0.'6mg'$  after 1 hour and then 1-2 tabs daily as needed until symptom resolution  Patient will be in [place] for several weeks with her daugther recovering from surgery and requested medication supplies to last through the stay out of town

## 2022-02-17 ENCOUNTER — Telehealth: Payer: Self-pay

## 2022-02-17 NOTE — Telephone Encounter (Unsigned)
Copied from [facility] [id number]. Topic: General - Inquiry >> Feb 14, 2022  1:31 PM Rosanne Ashing P wrote: Reason for CRM: pt called saying the pharmacy is trying to contact the provider on the medication for her gout.  Pt ask if the provider would contact the pharmacy.

## 2022-02-18 NOTE — Telephone Encounter (Signed)
[facility] pharmacy need clarification on the Colchicine directions. Per pharmacist it wasn't clear. Please review and send in new prescription with corrections. thanks

## 2022-02-19 MED ORDER — COLCHICINE 0.6 MG PO TABS: ORAL_TABLET | ORAL | 1 refills | Status: DC

## 2022-02-19 NOTE — Telephone Encounter (Signed)
Updated RX signature to include 1.'2mg'$  (tabs) at first sign of gout flare followed by 1 tablet (0.'6mg'$ ) one hour after initial dose and then 1 additional tablet 12 hours on the first day of symptoms. For the following days, patient should take oen tablet (0.'6mg'$ ) daily until symptoms resolve    [name], MD  [facility]

## 2022-02-22 DIAGNOSIS — M109 Gout, unspecified: Secondary | ICD-10-CM | POA: Diagnosis not present

## 2022-04-01 DIAGNOSIS — M10072 Idiopathic gout, left ankle and foot: Secondary | ICD-10-CM | POA: Diagnosis not present

## 2022-04-01 DIAGNOSIS — E119 Type 2 diabetes mellitus without complications: Secondary | ICD-10-CM | POA: Diagnosis not present

## 2022-04-01 DIAGNOSIS — M2022 Hallux rigidus, left foot: Secondary | ICD-10-CM | POA: Diagnosis not present

## 2022-04-28 ENCOUNTER — Telehealth: Payer: Self-pay | Admitting: Family Medicine

## 2022-04-28 MED ORDER — LOSARTAN POTASSIUM 50 MG PO TABS: 50.0000 mg | ORAL_TABLET | Freq: Every day | ORAL | 3 refills | Status: DC

## 2022-04-28 NOTE — Telephone Encounter (Signed)
[facility] faxed refill request for the following medications:  losartan (COZAAR) 50 MG tablet    Please advise.

## 2022-04-28 NOTE — Telephone Encounter (Signed)
Duplicate request

## 2022-06-29 NOTE — Progress Notes (Unsigned)
I,Laurie Watson,acting as a scribe for [employer] Simmons-Robinson, Watson.,have documented all relevant documentation on the behalf of Laurie Watson,as directed by  Laurie Watson while in the presence of Laurie Watson.   Established patient visit   Patient: Laurie Watson   DOB: 04/23/1950   72 y.o. Female  MRN: [id number] Visit Date: 06/30/2022  Today's healthcare provider: [name], Watson   Chief Complaint  Patient presents with  . Follow-up   Subjective    HPI   Hypertension, follow-up  BP Readings from Last 3 Encounters:  06/30/22 134/77  02/10/22 (!) 123/57  01/09/22 (!) 145/77   Wt Readings from Last 3 Encounters:  06/30/22 140 lb 14.4 oz (63.9 kg)  02/10/22 155 lb 9.6 oz (70.6 kg)  01/09/22 161 lb 1.6 oz (73.1 kg)     She was last seen for hypertension 5 months ago.  BP at that visit was 123/57. Management since that visit includes continue current medication including losartan 50mg  and Amlodipine 10 mg  She reports excellent compliance with treatment. She is not having side effects.  She states that she has been going to the gym   Outside blood pressures are 110's-130's/60's. Symptoms: No chest pain No chest pressure  No palpitations No syncope  No dyspnea No orthopnea  No paroxysmal nocturnal dyspnea No lower extremity edema   Pertinent labs Lab Results  Component Value Date   CHOL 163 01/09/2022   HDL 54 01/09/2022   LDLCALC 86 01/09/2022   TRIG 132 01/09/2022   CHOLHDL 3.0 01/09/2022   Lab Results  Component Value Date   NA 138 01/09/2022   K 4.3 01/09/2022   CREATININE 0.62 01/09/2022   EGFR 96 01/09/2022   GLUCOSE 115 (H) 01/09/2022   TSH 1.050 01/01/2021     The 10-year ASCVD risk score (Arnett DK, et al., 2019) is: 21.3%  --------------------------------------------------------------------------------------------------- Diabetes Mellitus Type II, Follow-up  Lab Results   Component Value Date   HGBA1C 6.9 (H) 01/09/2022   HGBA1C 6.3 (A) 07/02/2021   HGBA1C 7.0 (H) 01/01/2021   Wt Readings from Last 3 Encounters:  06/30/22 140 lb 14.4 oz (63.9 kg)  02/10/22 155 lb 9.6 oz (70.6 kg)  01/09/22 161 lb 1.6 oz (73.1 kg)   Last seen for diabetes 5 months ago.  Management since then includes increase Mounjaro to 5mg  weekly, new prescription filled for 6mL with refills. She reports excellent compliance with treatment. She is not having side effects.  Symptoms: No fatigue No foot ulcerations  Yes appetite changes No nausea  No paresthesia of the feet  No polydipsia  No polyuria No visual disturbances   No vomiting     Home blood sugar records: fasting range: 90-120's  Episodes of hypoglycemia? No    Current insulin regiment: none  Pertinent Labs: Lab Results  Component Value Date   CHOL 163 01/09/2022   HDL 54 01/09/2022   LDLCALC 86 01/09/2022   TRIG 132 01/09/2022   CHOLHDL 3.0 01/09/2022   Lab Results  Component Value Date   NA 138 01/09/2022   K 4.3 01/09/2022   CREATININE 0.62 01/09/2022   EGFR 96 01/09/2022   LABMICR 25.0 01/09/2022   MICRALBCREAT 39 (H) 01/09/2022     --------------------------------------------------------------------------------------------------- Gout  Patient here for chronic gout  The patient reports some gout flare ups. Reports she saw Dr. Ether GriffinsFowler and receive a cortisone shot. Overall doing well. Medications for gout include colchicine 0.6mg  with instructions based on onset  of acute attacks.  She states that she has been on her feet helping to take care of her mom  She was counseled on starting allopurinol but prefers to not start this medication    Lipid/Cholesterol, Follow-up  Last lipid panel Other pertinent labs  Lab Results  Component Value Date   CHOL 163 01/09/2022   HDL 54 01/09/2022   LDLCALC 86 01/09/2022   TRIG 132 01/09/2022   CHOLHDL 3.0 01/09/2022   Lab Results  Component Value Date    ALT 32 01/09/2022   AST 39 01/09/2022   PLT 278 01/01/2021   TSH 1.050 01/01/2021     She was last seen for this 5 months ago.  Management since that visit includes none.Continue simvastatin 20mg  daily   The 10-year ASCVD risk score (Arnett DK, et al., 2019) is: 21.3%  --------------------------------------------------------------------------------------------------- Health Updates: has been walking, rejoined the [facility], her daughter has recovered well from breast reconstruction  Medications: Outpatient Medications Prior to Visit  Medication Sig  . amLODipine (NORVASC) 10 MG tablet Take 1 tablet (10 mg total) by mouth daily.  Marland Kitchen aspirin 81 MG chewable tablet Chew 1 tablet by mouth daily.  . cholecalciferol (VITAMIN D) 1000 UNITS tablet Take 1 tablet by mouth daily.  . Coenzyme Q10 (COQ10 PO) Take by mouth.  . colchicine 0.6 MG tablet Take 2 tablets for (total 1.2mg ) at first sign of gout flare followed by 1 tablet (0.6mg ) after 1 hour, then 1 tablet after 12 hours as needed on first day of symptoms. Take one tablet by mouth daily until symptoms have resolved after the initial day of dosing  . COLLAGEN-VITAMIN C-BIOTIN PO Take by mouth. 6g per serving  . docusate sodium (COLACE) 100 MG capsule Take 1 capsule by mouth daily.  Marland Kitchen glucose blood test strip To check blood sugar daily  . losartan (COZAAR) 50 MG tablet Take 1 tablet (50 mg total) by mouth daily.  . Microlet Lancets MISC Use once daily to check blood sugar  . Omega-3 Fatty Acids (FISH OIL BURP-LESS) 1000 MG CAPS Take 1 capsule by mouth daily.  Marland Kitchen omeprazole (PRILOSEC) 20 MG capsule Take 1 capsule (20 mg total) by mouth daily.  . Probiotic CAPS Take 1 capsule by mouth daily.  . simvastatin (ZOCOR) 20 MG tablet Take 1 tablet (20 mg total) by mouth daily.  . [DISCONTINUED] metFORMIN (GLUCOPHAGE) 1000 MG tablet Take 1 tablet (1,000 mg total) by mouth 2 (two) times daily with a meal.  . [DISCONTINUED] tirzepatide (MOUNJARO) 5 MG/0.5ML  Pen Inject 5 mg into the skin once a week.  . Cinnamon 500 MG capsule Take 500 mg by mouth daily.   No facility-administered medications prior to visit.    Review of Systems     Objective    BP 134/77 (BP Location: Left Arm, Patient Position: Sitting, Cuff Size: Normal)   Pulse 64   Temp 98.2 F (36.8 C) (Oral)   Resp 16   Wt 140 lb 14.4 oz (63.9 kg)   BMI 24.96 kg/m    Physical Exam Vitals reviewed.  Constitutional:      General: She is not in acute distress.    Appearance: Normal appearance. She is not ill-appearing, toxic-appearing or diaphoretic.  Eyes:     Conjunctiva/sclera: Conjunctivae normal.  Neck:     Thyroid: No thyroid mass, thyromegaly or thyroid tenderness.     Vascular: No carotid bruit.  Cardiovascular:     Rate and Rhythm: Normal rate and regular rhythm.  Pulses: Normal pulses.     Heart sounds: Normal heart sounds. No murmur heard.    No friction rub. No gallop.  Pulmonary:     Effort: Pulmonary effort is normal. No respiratory distress.     Breath sounds: Normal breath sounds. No stridor. No wheezing, rhonchi or rales.  Abdominal:     General: Bowel sounds are normal. There is no distension.     Palpations: Abdomen is soft.     Tenderness: There is no abdominal tenderness.  Musculoskeletal:     Right lower leg: No edema.     Left lower leg: No edema.       Feet:  Feet:     Comments: Mild erythema at base of left great toe, tenderness to palpation, no edema  Lymphadenopathy:     Cervical: No cervical adenopathy.  Skin:    Findings: Erythema present.     Comments: Erythema at base of left great toe   Neurological:     Mental Status: She is alert and oriented to person, place, and time.     No results found for any visits on 06/30/22.  Assessment & Plan     Problem List Items Addressed This Visit       Cardiovascular and Mediastinum   Essential hypertension - Primary    Controlled BP at goal Continue current medications at  current doses No medications changes today          Endocrine   Type 2 diabetes mellitus with hyperlipidemia    Chronic  Stable  She will continue metformin with decreased dose from 1000mg  twice daily to 1000mg  once daily  We will increase mounjaro to 7.5mg  weekly, she has been tolerating this well  She has lost 15 pounds on the clinic scale  Encouraged her to continue regular physical activity along with dietary management  She will follow up in 6 months for DM Repeat A1c collected today         Relevant Medications   metFORMIN (GLUCOPHAGE) 1000 MG tablet   tirzepatide (MOUNJARO) 7.5 MG/0.5ML Pen     Musculoskeletal and Integument   Chronic gout involving toe of left foot without tophus    Chronic  Reports some tenderness of the left foot site but is overall improved  She has colchicine 0.6mg  to use PRN for flares, patient prefers to not start daily allopurinol at this time  No medication changes today, she will continue current regimen PRN         Other   Hypercholesteremia    Chronic  Patient has been able to make dietary changes to improve cholesterol levels  She will continue with simvastatin 20mg  daily  Lipid panel ordered today         Return in about 6 months (around 12/30/2022) for DM, HTN, lipids .        The entirety of the information documented in the History of Present Illness, Review of Systems and Physical Exam were personally obtained by me. Portions of this information were initially documented by Hetty Ely, CMA. I, Laurie Ramp, Watson have reviewed the documentation above for thoroughness and accuracy.      Laurie Ramp, Watson  [facility] [phone] (phone) [phone] (fax)  [facility]

## 2022-06-30 ENCOUNTER — Ambulatory Visit (INDEPENDENT_AMBULATORY_CARE_PROVIDER_SITE_OTHER): Payer: Medicare HMO | Admitting: Family Medicine

## 2022-06-30 ENCOUNTER — Encounter: Payer: Self-pay | Admitting: Family Medicine

## 2022-06-30 ENCOUNTER — Telehealth: Payer: Self-pay | Admitting: Family Medicine

## 2022-06-30 VITALS — BP 134/77 | HR 64 | Temp 98.2°F | Resp 16 | Wt 140.9 lb

## 2022-06-30 DIAGNOSIS — I10 Essential (primary) hypertension: Secondary | ICD-10-CM | POA: Diagnosis not present

## 2022-06-30 DIAGNOSIS — E1169 Type 2 diabetes mellitus with other specified complication: Secondary | ICD-10-CM

## 2022-06-30 DIAGNOSIS — E785 Hyperlipidemia, unspecified: Secondary | ICD-10-CM | POA: Diagnosis not present

## 2022-06-30 DIAGNOSIS — E113293 Type 2 diabetes mellitus with mild nonproliferative diabetic retinopathy without macular edema, bilateral: Secondary | ICD-10-CM

## 2022-06-30 DIAGNOSIS — E7800 Pure hypercholesterolemia, unspecified: Secondary | ICD-10-CM

## 2022-06-30 DIAGNOSIS — M1A4720 Other secondary chronic gout, left ankle and foot, without tophus (tophi): Secondary | ICD-10-CM | POA: Diagnosis not present

## 2022-06-30 DIAGNOSIS — E11319 Type 2 diabetes mellitus with unspecified diabetic retinopathy without macular edema: Secondary | ICD-10-CM

## 2022-06-30 MED ORDER — TIRZEPATIDE 7.5 MG/0.5ML ~~LOC~~ SOAJ: 7.5000 mg | SUBCUTANEOUS | 1 refills | Status: DC

## 2022-06-30 MED ORDER — METFORMIN HCL 1000 MG PO TABS: 1000.0000 mg | ORAL_TABLET | Freq: Every day | ORAL | 1 refills | Status: DC

## 2022-06-30 NOTE — Assessment & Plan Note (Signed)
Chronic  Reports some tenderness of the left foot site but is overall improved  She has colchicine 0.6mg  to use PRN for flares, patient prefers to not start daily allopurinol at this time  No medication changes today, she will continue current regimen PRN

## 2022-06-30 NOTE — Assessment & Plan Note (Addendum)
Chronic  Patient has been able to make dietary changes to improve cholesterol levels  She will continue with simvastatin 20mg  daily  Lipid panel ordered today

## 2022-06-30 NOTE — Assessment & Plan Note (Signed)
Controlled BP at goal Continue current medications at current doses No medications changes today   

## 2022-06-30 NOTE — Patient Instructions (Signed)
It was a pleasure to see you today!  Thank you for choosing [facility] for your primary care.   Laurie Watson was seen for diabetes,cholesterol and blood pressure.   Our plans for today were: Your numbers look good for your blood pressure today  We made a slight adjustment to your metformin to take 1000mg  once daily and you will increase to 7.5mg  weekly on the [facility].   To keep you healthy, please keep in mind the following health maintenance items that you are due for:   Tetanus  COVID vaccine    You should return to our clinic in 6 months for chronic conditions.   Best Wishes,   Dr. Roxan Hockey

## 2022-06-30 NOTE — Assessment & Plan Note (Signed)
Chronic  Stable  She will continue metformin with decreased dose from 1000mg  twice daily to 1000mg  once daily  We will increase mounjaro to 7.5mg  weekly, she has been tolerating this well  She has lost 15 pounds on the clinic scale  Encouraged her to continue regular physical activity along with dietary management  She will follow up in 6 months for DM

## 2022-06-30 NOTE — Assessment & Plan Note (Addendum)
Chronic  Stable  She will continue metformin with decreased dose from 1000mg  twice daily to 1000mg  once daily  We will increase mounjaro to 7.5mg  weekly, she has been tolerating this well  She has lost 15 pounds on the clinic scale  Encouraged her to continue regular physical activity along with dietary management  She will follow up in 6 months for DM Repeat A1c collected today

## 2022-06-30 NOTE — Telephone Encounter (Signed)
Pt is calling in because she had an appointment with Dr. Roxan Hockey today and says Dr. Roxan Hockey changed the dose of her Mounjaro to .75 and per pt [facility] pharmacy says that because pt is a tier 3, the medication is going to cost $800 USD. Pt says the pharmacy faxed over a form that Dr. Roxan Hockey needs to fill out and send back. Pt is asking this be done as soon as possible so she can get her shots. Pt says if there are questions please reach out to her.

## 2022-07-01 ENCOUNTER — Other Ambulatory Visit: Payer: Self-pay | Admitting: Family Medicine

## 2022-07-01 ENCOUNTER — Telehealth: Payer: Self-pay | Admitting: Family Medicine

## 2022-07-01 DIAGNOSIS — E113293 Type 2 diabetes mellitus with mild nonproliferative diabetic retinopathy without macular edema, bilateral: Secondary | ICD-10-CM

## 2022-07-01 LAB — HEMOGLOBIN A1C
Est. average glucose Bld gHb Est-mCnc: 114 mg/dL
Hgb A1c MFr Bld: 5.6 % (ref 4.8–5.6)

## 2022-07-01 MED ORDER — TIRZEPATIDE 5 MG/0.5ML ~~LOC~~ SOAJ: 5.0000 mg | SUBCUTANEOUS | 2 refills | Status: DC

## 2022-07-01 NOTE — Telephone Encounter (Signed)
Contacted patient via telephone   Informed patient of A1c 5.6. congratulated patient on achieving normal A1c.   Provided updated information regarding continuing Mounjaro 5mg  weekly instead of initial plan to increase to 7.5mg  weekly. Patient was in agreement with this plan.   Confirmed receipt of fax from [facility] requesting information for [facility] as patient states she was told she would reach donus hole and cost of medication would increase from $45/mo to $771.   Ronnald Ramp, MD  [facility]

## 2022-07-02 DIAGNOSIS — M5417 Radiculopathy, lumbosacral region: Secondary | ICD-10-CM | POA: Diagnosis not present

## 2022-07-02 DIAGNOSIS — M9902 Segmental and somatic dysfunction of thoracic region: Secondary | ICD-10-CM | POA: Diagnosis not present

## 2022-07-02 DIAGNOSIS — M9904 Segmental and somatic dysfunction of sacral region: Secondary | ICD-10-CM | POA: Diagnosis not present

## 2022-07-02 DIAGNOSIS — M9905 Segmental and somatic dysfunction of pelvic region: Secondary | ICD-10-CM | POA: Diagnosis not present

## 2022-07-02 DIAGNOSIS — M9906 Segmental and somatic dysfunction of lower extremity: Secondary | ICD-10-CM | POA: Diagnosis not present

## 2022-07-02 DIAGNOSIS — M9903 Segmental and somatic dysfunction of lumbar region: Secondary | ICD-10-CM | POA: Diagnosis not present

## 2022-07-02 NOTE — Telephone Encounter (Signed)
Form has been completed and will be returned to pharmacy via fax.   Ronnald Ramp, MD  [facility]

## 2022-09-02 DIAGNOSIS — Z85828 Personal history of other malignant neoplasm of skin: Secondary | ICD-10-CM | POA: Diagnosis not present

## 2022-09-02 DIAGNOSIS — D2262 Melanocytic nevi of left upper limb, including shoulder: Secondary | ICD-10-CM | POA: Diagnosis not present

## 2022-09-02 DIAGNOSIS — D2261 Melanocytic nevi of right upper limb, including shoulder: Secondary | ICD-10-CM | POA: Diagnosis not present

## 2022-09-02 DIAGNOSIS — Z08 Encounter for follow-up examination after completed treatment for malignant neoplasm: Secondary | ICD-10-CM | POA: Diagnosis not present

## 2022-09-02 DIAGNOSIS — D225 Melanocytic nevi of trunk: Secondary | ICD-10-CM | POA: Diagnosis not present

## 2022-09-02 DIAGNOSIS — L821 Other seborrheic keratosis: Secondary | ICD-10-CM | POA: Diagnosis not present

## 2022-09-03 ENCOUNTER — Other Ambulatory Visit: Payer: Self-pay | Admitting: Family Medicine

## 2022-09-03 DIAGNOSIS — I10 Essential (primary) hypertension: Secondary | ICD-10-CM

## 2022-09-03 DIAGNOSIS — E113293 Type 2 diabetes mellitus with mild nonproliferative diabetic retinopathy without macular edema, bilateral: Secondary | ICD-10-CM

## 2022-09-03 DIAGNOSIS — K219 Gastro-esophageal reflux disease without esophagitis: Secondary | ICD-10-CM

## 2022-09-03 DIAGNOSIS — E7800 Pure hypercholesterolemia, unspecified: Secondary | ICD-10-CM

## 2022-10-24 ENCOUNTER — Other Ambulatory Visit: Payer: Self-pay | Admitting: Family Medicine

## 2022-10-24 NOTE — Telephone Encounter (Signed)
Requested Prescriptions  Pending Prescriptions Disp Refills   colchicine 0.6 MG tablet [Pharmacy Med Name: COLCHICINE 0.6 MG Tablet] 60 tablet 5    Sig: USE AS DIRECTED BY YOUR PRESCRIBER. COMPLETE DIRECTIONS ARE INCLUDED IN A LETTER WITH YOUR ORIGINAL ORDER     Endocrinology:  Gout Agents - colchicine Failed - 10/24/2022  9:29 AM      Failed - CBC within normal limits and completed in the last 12 months    WBC  Date Value Ref Range Status  01/01/2021 5.2 3.4 - 10.8 x10E3/uL Final   RBC  Date Value Ref Range Status  01/01/2021 4.61 3.77 - 5.28 x10E6/uL Final   Hemoglobin  Date Value Ref Range Status  01/01/2021 13.6 11.1 - 15.9 g/dL Final   Hematocrit  Date Value Ref Range Status  01/01/2021 41.4 34.0 - 46.6 % Final   MCHC  Date Value Ref Range Status  01/01/2021 32.9 31.5 - 35.7 g/dL Final   [facility]  Date Value Ref Range Status  01/01/2021 29.5 26.6 - 33.0 pg Final   MCV  Date Value Ref Range Status  01/01/2021 90 79 - 97 fL Final   No results found for: "PLTCOUNTKUC", "LABPLAT", "POCPLA" RDW  Date Value Ref Range Status  01/01/2021 12.5 11.7 - 15.4 % Final         Passed - Cr in normal range and within 360 days    Creatinine, Ser  Date Value Ref Range Status  01/09/2022 0.62 0.57 - 1.00 mg/dL Final         Passed - ALT in normal range and within 360 days    ALT  Date Value Ref Range Status  01/09/2022 32 0 - 32 IU/L Final         Passed - AST in normal range and within 360 days    AST  Date Value Ref Range Status  01/09/2022 39 0 - 40 IU/L Final         Passed - Valid encounter within last 12 months    Recent Outpatient Visits           3 months ago Essential hypertension   [facility] Simmons-Robinson, [place], MD   8 months ago Diabetic retinopathy associated with controlled type 2 diabetes mellitus (HCC)   [facility] Simmons-Robinson, [place], MD   9 months ago Type 2 diabetes mellitus with both  eyes affected by mild nonproliferative retinopathy without macular edema, without long-term current use of insulin (HCC)   [facility] Simmons-Robinson, [place], MD   1 year ago Type 2 diabetes mellitus with both eyes affected by mild nonproliferative retinopathy without macular edema, without long-term current use of insulin (HCC)   [facility] Bosie Clos, MD   1 year ago Encounter for annual wellness exam in Medicare patient   [facility] Bosie Clos, MD       Future Appointments             In 2 months Simmons-Robinson, Tawanna Cooler, MD [facility]

## 2022-10-29 ENCOUNTER — Other Ambulatory Visit: Payer: Self-pay

## 2022-10-29 MED ORDER — COLCHICINE 0.6 MG PO TABS: ORAL_TABLET | ORAL | 5 refills | Status: DC

## 2022-11-27 ENCOUNTER — Other Ambulatory Visit: Payer: Self-pay | Admitting: Family Medicine

## 2022-11-27 DIAGNOSIS — Z1231 Encounter for screening mammogram for malignant neoplasm of breast: Secondary | ICD-10-CM

## 2022-12-31 ENCOUNTER — Encounter: Payer: Self-pay | Admitting: Family Medicine

## 2022-12-31 ENCOUNTER — Ambulatory Visit (INDEPENDENT_AMBULATORY_CARE_PROVIDER_SITE_OTHER): Payer: Medicare HMO | Admitting: Family Medicine

## 2022-12-31 VITALS — BP 137/70 | HR 74 | Temp 97.8°F | Ht 63.0 in | Wt 136.9 lb

## 2022-12-31 DIAGNOSIS — E1169 Type 2 diabetes mellitus with other specified complication: Secondary | ICD-10-CM | POA: Diagnosis not present

## 2022-12-31 DIAGNOSIS — E7800 Pure hypercholesterolemia, unspecified: Secondary | ICD-10-CM | POA: Diagnosis not present

## 2022-12-31 DIAGNOSIS — I10 Essential (primary) hypertension: Secondary | ICD-10-CM

## 2022-12-31 DIAGNOSIS — E785 Hyperlipidemia, unspecified: Secondary | ICD-10-CM | POA: Diagnosis not present

## 2022-12-31 DIAGNOSIS — Z23 Encounter for immunization: Secondary | ICD-10-CM | POA: Diagnosis not present

## 2022-12-31 DIAGNOSIS — K219 Gastro-esophageal reflux disease without esophagitis: Secondary | ICD-10-CM

## 2022-12-31 DIAGNOSIS — E11319 Type 2 diabetes mellitus with unspecified diabetic retinopathy without macular edema: Secondary | ICD-10-CM | POA: Diagnosis not present

## 2022-12-31 DIAGNOSIS — Z7984 Long term (current) use of oral hypoglycemic drugs: Secondary | ICD-10-CM

## 2022-12-31 DIAGNOSIS — M1A4720 Other secondary chronic gout, left ankle and foot, without tophus (tophi): Secondary | ICD-10-CM | POA: Diagnosis not present

## 2022-12-31 DIAGNOSIS — B0223 Postherpetic polyneuropathy: Secondary | ICD-10-CM

## 2022-12-31 MED ORDER — OMEPRAZOLE 20 MG PO CPDR: 20.0000 mg | DELAYED_RELEASE_CAPSULE | Freq: Every day | ORAL | 3 refills | Status: DC

## 2022-12-31 MED ORDER — GABAPENTIN 100 MG PO CAPS: 100.0000 mg | ORAL_CAPSULE | Freq: Every day | ORAL | 1 refills | Status: DC

## 2022-12-31 MED ORDER — LOSARTAN POTASSIUM 50 MG PO TABS: 50.0000 mg | ORAL_TABLET | Freq: Every day | ORAL | 3 refills | Status: DC

## 2022-12-31 MED ORDER — AMLODIPINE BESYLATE 10 MG PO TABS: 10.0000 mg | ORAL_TABLET | Freq: Every day | ORAL | 3 refills | Status: DC

## 2022-12-31 MED ORDER — SIMVASTATIN 20 MG PO TABS: 20.0000 mg | ORAL_TABLET | Freq: Every day | ORAL | 3 refills | Status: DC

## 2022-12-31 NOTE — Assessment & Plan Note (Signed)
Well controlled on Simvastatin 20mg  daily.  Chronic Lipid panel ordered  -Continue Simvastatin 20mg  dai

## 2022-12-31 NOTE — Assessment & Plan Note (Signed)
Well controlled on Losartan 50mg  daily and Amlodipine 10mg  daily.   Chronic BP at goal -Refill Losartan and Amlodipine prescriptions.

## 2022-12-31 NOTE — Patient Instructions (Signed)
VISIT SUMMARY:  During your recent visit, we discussed your diabetes, hypertension, hyperlipidemia, gout, and post-herpetic neuralgia. We also reviewed your general health maintenance. You expressed concerns about the long-term use of metformin due to potential damage, citing your father's experience. Your blood pressure readings were consistently within a healthy range, and your cholesterol levels were well controlled with simvastatin. Your gout was under control, and you reported occasional discomfort from a previous shingles outbreak.  YOUR PLAN:  -TYPE 2 DIABETES MELLITUS: This is a condition where your body doesn't use insulin properly, leading to high blood sugar levels. We decided to discontinue Metformin due to your concerns. We will check your HbA1c today and adjust your Mounjaro dosage based on the results. You should continue to monitor your blood sugars at home.  -HYPERTENSION: This is a condition where your blood pressure is consistently too high. Your hypertension is well controlled on Losartan and Amlodipine, and we will refill these prescriptions.  -HYPERLIPIDEMIA: This is a condition where you have high levels of fats in your blood. Your hyperlipidemia is well controlled on Simvastatin, and you should continue this medication.  -GOUT: This is a type of arthritis that causes painful inflammation in one or more joints. Your gout is under control, and we will check your Uric Acid level today.  -POST-HERPETIC NEURALGIA: This is a complication of shingles, which can cause severe pain even after the shingles rash has cleared up. We will prescribe Gabapentin for you to take as needed for pain.  -GENERAL HEALTH MAINTENANCE: We discussed your upcoming health maintenance activities, including an eye exam due in December, a mammogram in two weeks, and updating your tetanus vaccine at the pharmacy. You are up to date on your pneumonia and shingles vaccines. We will schedule your annual wellness  visit over the phone in 10 days.  INSTRUCTIONS:  Please continue to monitor your blood sugar levels at home and report any increases. Take your prescribed medications as directed. Remember to attend your scheduled health maintenance activities, including your eye exam, mammogram, and tetanus vaccine update. We will schedule your annual wellness visit over the phone in 10 days. Please return to the office for a follow-up visit in 6 months.

## 2022-12-31 NOTE — Assessment & Plan Note (Signed)
chronic Well controlled with fasting blood sugars in the 90-106 range. Currently on Mounjaro 5 once weekly and Metformin 1000mg  daily. Patient has concerns about long-term Metformin use due to family history.   -Discontinue Metformin.   -Check HbA1c today.   -If HbA1c is under 7, continue Mounjaro 5 once weekly. If HbA1c is over 7, consider increasing Mounjaro to 7.5 once weekly.   -Patient to monitor blood sugars at home and report if she starts to increase.

## 2022-12-31 NOTE — Assessment & Plan Note (Signed)
Annual eye exam due in December.

## 2022-12-31 NOTE — Progress Notes (Signed)
Established patient visit   Patient: Laurie Watson   DOB: Aug 07, 1950   72 y.o. Female  MRN: [id number] Visit Date: 12/31/2022  Today's healthcare provider: Ronnald Ramp, MD   Chief Complaint  Patient presents with   Medical Management of Chronic Issues    6 month follow-up   Follow-up    Talk about sugar, flu shot and meds with [facility]   Subjective     HPI     Medical Management of Chronic Issues    Additional comments: 6 month follow-up        Follow-up    Additional comments: Talk about sugar, flu shot and meds with [facility]      Last edited by Clois Comber on 12/31/2022  9:21 AM.       Discussed the use of AI scribe software for clinical note transcription with the patient, who gave verbal consent to proceed.  History of Present Illness   The patient, with a history of diabetes, presented for a follow-up consultation. She reported a recent trip to [place] during which she continued taking two metformin tablets daily, despite previous discussions about reducing the dosage. The patient also reported stretching out the use of her Mounjaro shots due to cost concerns, extending the interval between doses to 1.5-2 weeks. Despite these changes, the patient's fasting blood sugar levels remained within a satisfactory range, with the highest recorded level being 106.  The patient expressed concerns about the long-term use of metformin due to potential damage, citing her father's experience. She also reported that her gout was under control and she had not needed to use her colchicine prescription frequently.  The patient's blood pressure readings were consistently within a healthy range. She also reported maintaining a healthy lifestyle, including a balanced diet and regular exercise.  The patient reported a recent bruise on her leg due to a minor accident at home. She also mentioned a history of shingles, with occasional discomfort in the affected  area.  The patient's medication regimen included simvastatin for cholesterol management, losartan for blood pressure control, and amlodipine. She also reported taking omeprazole, but did not specify the reason for this medication.  The patient's upcoming health maintenance activities included a mammogram scheduled in two weeks and a diabetes eye exam due in December. She also mentioned a recent flu shot and inquired about the need for a pneumonia vaccine and a tetanus shot.  The patient's spouse was also mentioned, with a pending knee surgery due to a torn meniscus and a hole in his kneecap. The patient expressed frustration with the repeated cancellations of the surgery due to insurance issues.         Past Medical History:  Diagnosis Date   Diabetes mellitus without complication (HCC)    Family history of adverse reaction to anesthesia    Daughter admitted to hosp after surgery(age 39). breathing difficulties, cholinesterase deficiency.   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   cholecalciferol (VITAMIN D) 1000 UNITS tablet Take 1 tablet by mouth daily.   Coenzyme Q10 (COQ10 PO) Take by mouth.   colchicine 0.6 MG tablet Take 2 tabs (1.2mg ) by mouth at first sign of  flare followed by 0.6mg  after 1 hour and then 1-2 tabs daily as needed until symptom resolution   COLLAGEN-VITAMIN C-BIOTIN PO Take by mouth. 6g per serving   docusate sodium (COLACE) 100 MG capsule Take 1 capsule by mouth daily.   glucose blood test strip To check blood sugar daily   Microlet Lancets MISC Use once daily to check blood sugar   Omega-3 Fatty Acids (FISH OIL BURP-LESS) 1000 MG CAPS Take 1 capsule by mouth daily.   Probiotic CAPS  Take 1 capsule by mouth daily.   [DISCONTINUED] amLODipine (NORVASC) 10 MG tablet TAKE 1 TABLET EVERY DAY   [DISCONTINUED] losartan (COZAAR) 50 MG tablet Take 1 tablet (50 mg total) by mouth daily.   [DISCONTINUED] metFORMIN (GLUCOPHAGE) 1000 MG tablet TAKE 1 TABLET TWICE DAILY WITH MEALS   [DISCONTINUED] omeprazole (PRILOSEC) 20 MG capsule TAKE 1 CAPSULE EVERY DAY   [DISCONTINUED] simvastatin (ZOCOR) 20 MG tablet TAKE 1 TABLET EVERY DAY   tirzepatide (MOUNJARO) 5 MG/0.5ML Pen Inject 5 mg into the skin once a week. (Patient not taking: Reported on 12/31/2022)   No facility-administered medications prior to visit.    Review of Systems  Last metabolic panel Lab Results  Component Value Date   GLUCOSE 115 (H) 01/09/2022   NA 138 01/09/2022   K 4.3 01/09/2022   CL 101 01/09/2022   CO2 22 01/09/2022   BUN 12 01/09/2022   CREATININE 0.62 01/09/2022   EGFR 96 01/09/2022   CALCIUM 9.6 01/09/2022   PROT 7.0 01/09/2022   ALBUMIN 4.8 01/09/2022   LABGLOB 2.2 01/09/2022   AGRATIO 2.2 01/09/2022   BILITOT 0.8 01/09/2022   ALKPHOS 82 01/09/2022   AST 39 01/09/2022   ALT 32 01/09/2022   Last lipids Lab Results  Component Value Date   CHOL 163 01/09/2022   HDL 54 01/09/2022   LDLCALC 86 01/09/2022   TRIG 132 01/09/2022   CHOLHDL 3.0 01/09/2022   Last hemoglobin A1c Lab Results  Component Value Date   HGBA1C 5.6 06/30/2022        Objective    BP 137/70 (BP Location: Left Arm, Patient Position: Sitting, Cuff Size: Normal)   Pulse 74   Temp 97.8 F (36.6 C)   Ht 5\' 3"  (1.6 m)   Wt 136 lb 14.4 oz (62.1 kg)   SpO2 100%   BMI 24.25 kg/m   BP Readings from Last 3 Encounters:  12/31/22 137/70  06/30/22 134/77  02/10/22 (!) 123/57   Wt Readings from Last 3 Encounters:  12/31/22 136 lb 14.4 oz (62.1 kg)  06/30/22 140 lb 14.4 oz (63.9 kg)  02/10/22 155 lb 9.6 oz (70.6 kg)       Physical Exam Vitals reviewed.  Constitutional:      General: She is not in acute  distress.    Appearance: Normal appearance. She is not ill-appearing, toxic-appearing or diaphoretic.  Eyes:     Conjunctiva/sclera: Conjunctivae normal.  Cardiovascular:     Rate and Rhythm: Normal rate and regular rhythm.     Pulses: Normal pulses.     Heart sounds: Normal heart sounds. No murmur heard.    No friction rub. No gallop.  Pulmonary:     Effort: Pulmonary effort is normal. No respiratory distress.     Breath sounds: Normal breath sounds. No stridor. No wheezing, rhonchi or rales.  Abdominal:     General: Bowel sounds are normal. There is no distension.     Palpations: Abdomen is soft.  Tenderness: There is no abdominal tenderness.  Musculoskeletal:     Right lower leg: No edema.     Left lower leg: No edema.     Comments: Bruise on lateral right lower extremity, recent trauma from book hitting leg, no skin breakdown   Skin:    Findings: No erythema or rash.  Neurological:     Mental Status: She is alert and oriented to person, place, and time.       No results found for any visits on 12/31/22.  Assessment & Plan     Problem List Items Addressed This Visit     Acid reflux   Relevant Medications   losartan (COZAAR) 50 MG tablet   amLODipine (NORVASC) 10 MG tablet   omeprazole (PRILOSEC) 20 MG capsule   simvastatin (ZOCOR) 20 MG tablet   Other Relevant Orders   Hemoglobin A1c   BMP8+EGFR   Uric acid   Chronic gout involving toe of left foot without tophus    No recent flares. Patient has Colchicine on hand for acute flares.   Chronic, stable -Check Uric Acid level today.       Relevant Orders   Uric acid   Diabetic retinopathy associated with controlled type 2 diabetes mellitus (HCC)    Annual eye exam due in December.        Relevant Medications   losartan (COZAAR) 50 MG tablet   simvastatin (ZOCOR) 20 MG tablet   Essential hypertension    Well controlled on Losartan 50mg  daily and Amlodipine 10mg  daily.   Chronic BP at goal -Refill  Losartan and Amlodipine prescriptions.        Relevant Medications   losartan (COZAAR) 50 MG tablet   amLODipine (NORVASC) 10 MG tablet   simvastatin (ZOCOR) 20 MG tablet   Other Relevant Orders   BMP8+EGFR   Hypercholesteremia    Well controlled on Simvastatin 20mg  daily.  Chronic Lipid panel ordered  -Continue Simvastatin 20mg  dai      Relevant Medications   losartan (COZAAR) 50 MG tablet   amLODipine (NORVASC) 10 MG tablet   simvastatin (ZOCOR) 20 MG tablet   Type 2 diabetes mellitus with hyperlipidemia (HCC) - Primary    chronic Well controlled with fasting blood sugars in the 90-106 range. Currently on Mounjaro 5 once weekly and Metformin 1000mg  daily. Patient has concerns about long-term Metformin use due to family history.   -Discontinue Metformin.   -Check HbA1c today.   -If HbA1c is under 7, continue Mounjaro 5 once weekly. If HbA1c is over 7, consider increasing Mounjaro to 7.5 once weekly.   -Patient to monitor blood sugars at home and report if she starts to increase.        Relevant Medications   losartan (COZAAR) 50 MG tablet   amLODipine (NORVASC) 10 MG tablet   simvastatin (ZOCOR) 20 MG tablet   Other Relevant Orders   Hemoglobin A1c   Other Visit Diagnoses     Need for influenza vaccination       Relevant Orders   Flu Vaccine Trivalent High Dose (Fluad) (Completed)   Shingles (herpes zoster) polyneuropathy       Relevant Medications   gabapentin (NEURONTIN) 100 MG capsule              Post-Herpetic Neuralgia   Occasional pain from previous shingles outbreak.   -Prescribe Gabapentin 100mg  at bedtime as needed for pain. Can increase to three times daily if needed and not causing excessive drowsiness.    General  Health Maintenance   -Mammogram scheduled in two weeks.   -Patient received flu shot.   -Patient up to date on pneumonia and shingles vaccines.   -Update Tetanus vaccine at pharmacy.   -Schedule annual wellness visit over the phone  in 10 days.   -Follow-up in office in 6 months.         Return in about 6 months (around 07/01/2023) for CHRONIC F/U.         Ronnald Ramp, MD  [facility] [phone] (phone) [phone] (fax)  [facility]

## 2022-12-31 NOTE — Assessment & Plan Note (Signed)
No recent flares. Patient has Colchicine on hand for acute flares.   Chronic, stable -Check Uric Acid level today.

## 2023-01-01 ENCOUNTER — Other Ambulatory Visit: Payer: Self-pay | Admitting: Family Medicine

## 2023-01-01 DIAGNOSIS — E1169 Type 2 diabetes mellitus with other specified complication: Secondary | ICD-10-CM

## 2023-01-01 LAB — BMP8+EGFR
BUN/Creatinine Ratio: 20 (ref 12–28)
BUN: 12 mg/dL (ref 8–27)
CO2: 25 mmol/L (ref 20–29)
Calcium: 9.7 mg/dL (ref 8.7–10.3)
Chloride: 101 mmol/L (ref 96–106)
Creatinine, Ser: 0.6 mg/dL (ref 0.57–1.00)
Glucose: 93 mg/dL (ref 70–99)
Potassium: 4.3 mmol/L (ref 3.5–5.2)
Sodium: 140 mmol/L (ref 134–144)
eGFR: 96 mL/min/{1.73_m2} (ref >59)

## 2023-01-01 LAB — URIC ACID: Uric Acid: 4.8 mg/dL (ref 3.0–7.2)

## 2023-01-01 LAB — HEMOGLOBIN A1C
Est. average glucose Bld gHb Est-mCnc: 117 mg/dL
Hgb A1c MFr Bld: 5.7 % — ABNORMAL HIGH (ref 4.8–5.6)

## 2023-01-01 MED ORDER — TIRZEPATIDE 5 MG/0.5ML ~~LOC~~ SOAJ: 5.0000 mg | SUBCUTANEOUS | 2 refills | Status: DC

## 2023-01-13 ENCOUNTER — Ambulatory Visit: Payer: Medicare HMO

## 2023-01-13 VITALS — Ht 63.0 in | Wt 136.0 lb

## 2023-01-13 DIAGNOSIS — Z0000 Encounter for general adult medical examination without abnormal findings: Secondary | ICD-10-CM | POA: Diagnosis not present

## 2023-01-13 NOTE — Progress Notes (Signed)
Subjective:   Laurie Watson is a 72 y.o. female who presents for Medicare Annual (Subsequent) preventive examination.  Visit Complete: Virtual I connected with  Charma Igo on 01/13/23 by a audio enabled telemedicine application and verified that I am speaking with the correct person using two identifiers.  Patient Location: Home  Provider Location: Office/Clinic  I discussed the limitations of evaluation and management by telemedicine. The patient expressed understanding and agreed to proceed.  Vital Signs: Because this visit was a virtual/telehealth visit, some criteria may be missing or patient reported. Any vitals not documented were not able to be obtained and vitals that have been documented are patient reported.  Patient Medicare AWV questionnaire was completed by the patient on 01/12/23; I have confirmed that all information answered by patient is correct and no changes since this date. Cardiac Risk Factors include: advanced age (>36men, >50 women);diabetes mellitus;dyslipidemia;hypertension    Objective:    Today's Vitals   01/13/23 1508 01/13/23 1520  Weight: 136 lb (61.7 kg)   Height: 5\' 3"  (1.6 m)   PainSc:  5    Body mass index is 24.09 kg/m.     01/13/2023    3:28 PM 03/12/2018    6:47 AM 01/18/2018    7:42 AM 12/12/2014   12:11 PM 09/05/2014    9:06 AM  Advanced Directives  Does Patient Have a Medical Advance Directive? Yes Yes Yes Yes Yes  Type of Estate agent of [place];Living will Healthcare Power of [place];Living will Living will Healthcare Power of [place];Living will Healthcare Power of [place];Living will    Current Medications (verified) Outpatient Encounter Medications as of 01/13/2023  Medication Sig   amLODipine (NORVASC) 10 MG tablet Take 1 tablet (10 mg total) by mouth daily.   aspirin 81 MG chewable tablet Chew 1 tablet by mouth daily.   cholecalciferol (VITAMIN D) 1000 UNITS tablet Take 1 tablet by mouth  daily.   Coenzyme Q10 (COQ10 PO) Take by mouth.   colchicine 0.6 MG tablet Take 2 tabs (1.2mg ) by mouth at first sign of flare followed by 0.6mg  after 1 hour and then 1-2 tabs daily as needed until symptom resolution   COLLAGEN-VITAMIN C-BIOTIN PO Take by mouth. 6g per serving   docusate sodium (COLACE) 100 MG capsule Take 1 capsule by mouth daily.   gabapentin (NEURONTIN) 100 MG capsule Take 1 capsule (100 mg total) by mouth at bedtime.   glucose blood test strip To check blood sugar daily   losartan (COZAAR) 50 MG tablet Take 1 tablet (50 mg total) by mouth daily.   Microlet Lancets MISC Use once daily to check blood sugar   Omega-3 Fatty Acids (FISH OIL BURP-LESS) 1000 MG CAPS Take 1 capsule by mouth daily.   omeprazole (PRILOSEC) 20 MG capsule Take 1 capsule (20 mg total) by mouth daily.   Probiotic CAPS Take 1 capsule by mouth daily.   simvastatin (ZOCOR) 20 MG tablet Take 1 tablet (20 mg total) by mouth daily.   tirzepatide [facility]) 5 MG/0.5ML Pen Inject 5 mg into the skin once a week.   No facility-administered encounter medications on file as of 01/13/2023.    Allergies (verified) Codeine and Penicillins   History: Past Medical History:  Diagnosis Date   Allergy    Diabetes mellitus without complication (HCC)    Family history of adverse reaction to anesthesia    Daughter admitted to hosp after surgery(age 61). breathing difficulties, cholinesterase deficiency.   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)   Past Surgical History:  Procedure Laterality Date   ACHILLES TENDON SURGERY Right 03/12/2018   Procedure: ACHILLES TENDON REPAIR-SECONDARY;  Surgeon: Recardo Evangelist, DPM;  Location: [facility];  Service: Podiatry;  Laterality: Right;  tendon anchors lma with popliteal block   ANAL FISSURE REPAIR   11/13/2013   BREAST BIOPSY Right 1990's   Core - neg   BREAST BIOPSY Right 01/29/2021   Stereo Bx, Coil Clip, path pending   CALCANEAL OSTEOTOMY Right 03/12/2018   Procedure: PARTIAL CALCANECTOMY;  Surgeon: Recardo Evangelist, DPM;  Location: [facility];  Service: Podiatry;  Laterality: Right;  Prediabetic   COLONOSCOPY WITH ESOPHAGOGASTRODUODENOSCOPY (EGD)  05/05/2011   COLONOSCOPY WITH PROPOFOL N/A 01/18/2018   Procedure: COLONOSCOPY WITH PROPOFOL;  Surgeon: Scot Jun, MD;  Location: [facility];  Service: Endoscopy;  Laterality: N/A;   ELBOW SURGERY Left    EYE SURGERY Left 1972   KNEE SURGERY Left    TONSILLECTOMY AND ADENOIDECTOMY  1970   TUBAL LIGATION     Family History  Problem Relation Age of Onset   Diabetes Mother    Bladder Cancer Mother    Breast cancer Mother 98   Cancer Mother    Hypertension Father    Diabetes Father    CVA Father    Cancer Father    Breast cancer Daughter 87       triple neg   Diabetes Brother    Hyperlipidemia Brother    Heart attack Brother    Diabetes Brother    Diabetes Brother    Social History   Socioeconomic History   Marital status: Married    Spouse name: Not on file   Number of children: Not on file   Years of education: Not on file   Highest education level: Bachelor's degree (e.g., BA, AB, BS)  Occupational History   Not on file  Tobacco Use   Smoking status: Never   Smokeless tobacco: Never  Vaping Use   Vaping status: Never Used  Substance and Sexual Activity   Alcohol use: Yes    Comment: Not often   Drug use: No   Sexual activity: Yes    Birth control/protection: None  Other Topics Concern   Not on file  Social History Narrative   Not on file   Social Determinants of Health   Financial Resource Strain: Low Risk  (01/12/2023)   Overall Financial Resource Strain (CARDIA)    Difficulty of Paying Living Expenses: Not hard at all  Food Insecurity: No Food Insecurity (01/12/2023)   Hunger  Vital Sign    Worried About Running Out of Food in the Last Year: Never true    Ran Out of Food in the Last Year: Never true  Transportation Needs: No Transportation Needs (01/12/2023)   PRAPARE - Administrator, Civil Service (Medical): No    Lack of Transportation (Non-Medical): No  Physical Activity: Insufficiently Active (01/12/2023)   Exercise Vital Sign    Days of Exercise per Week: 4 days    Minutes of Exercise per Session: 30 min  Stress: No Stress Concern Present (01/12/2023)   [employer] of Occupational Health - Occupational Stress Questionnaire    Feeling of Stress : Not at all  Social Connections: Moderately Integrated (01/12/2023)   Social Connection and Isolation  Panel [NHANES]    Frequency of Communication with Friends and Family: Once a week    Frequency of Social Gatherings with Friends and Family: Once a week    Attends Religious Services: More than 4 times per year    Active Member of [employer] or Organizations: Yes    Attends Engineer, structural: More than 4 times per year    Marital Status: Married    Tobacco Counseling Counseling given: Not Answered   Clinical Intake:  Pre-visit preparation completed: No  Pain : 0-10 Pain Score: 5  Pain Type: Chronic pain, Neuropathic pain Pain Location: Leg (from hip down leg) Pain Orientation: Left Pain Radiating Towards: from hip down leg Pain Descriptors / Indicators: Aching, Radiating Pain Onset: More than a month ago Pain Frequency: Intermittent Pain Relieving Factors: exercise helps  Pain Relieving Factors: exercise helps  BMI - recorded: 24.09 Nutritional Status: BMI of 19-24  Normal Nutritional Risks: None Diabetes: Yes CBG done?: Yes (BS runs in 90's once weekly) CBG resulted in Enter/ Edit results?: No Did pt. bring in CBG monitor from home?: No  How often do you need to have someone help you when you read instructions, pamphlets, or other written materials from your doctor  or pharmacy?: 1 - Never  Interpreter Needed?: No  Comments: lives with husband Information entered by :: B.Liannah Yarbough,LPN   Activities of Daily Living    01/12/2023    5:22 PM 06/30/2022   10:44 AM  In your present state of health, do you have any difficulty performing the following activities:  Hearing? 0 0  Vision? 0 0  Difficulty concentrating or making decisions? 0 0  Walking or climbing stairs? 0 0  Dressing or bathing? 0 0  Doing errands, shopping? 0 0  Preparing Food and eating ? N   Using the Toilet? N   In the past six months, have you accidently leaked urine? N   Do you have problems with loss of bowel control? N   Managing your Medications? N   Managing your Finances? N   Housekeeping or managing your Housekeeping? N     Patient Care Team: Ronnald Ramp, MD as PCP - General (Family Medicine) Blair Promise, OD (Optometry)  Indicate any recent Medical Services you may have received from other than [facility] providers in the past year (date may be approximate).     Assessment:   This is a routine wellness examination for Lisl.  Hearing/Vision screen Hearing Screening - Comments:: Pt says her hearing is good Vision Screening - Comments:: Pt says her vision is ok: legally blind in left eye;good vision in rt eye Dr Franz Dell   Goals Addressed             This Visit's Progress    Exercise 150 minutes per week (moderate activity)   On track      Depression Screen    12/31/2022    9:30 AM 06/30/2022   10:44 AM 01/09/2022   10:10 AM 07/02/2021    9:47 AM 01/01/2021    9:38 AM 12/15/2018    9:56 AM 11/11/2017   10:19 AM  PHQ 2/9 Scores  PHQ - 2 Score 0 0 0 0 1 0 0  PHQ- 9 Score    0 1  0    Fall Risk    01/12/2023    5:22 PM 06/30/2022   10:44 AM 01/09/2022   10:00 AM 07/02/2021    9:47 AM 01/01/2021    9:37 AM  Fall  Risk   Falls in the past year? 0 0 0 0 0  Number falls in past yr: 0 0 0 0 0  Injury with Fall? 0 0 0 0 0  Risk for fall  due to : No Fall Risks No Fall Risks No Fall Risks No Fall Risks;Impaired balance/gait No Fall Risks  Follow up Education provided;Falls prevention discussed   Falls evaluation completed Falls evaluation completed    MEDICARE RISK AT HOME: Medicare Risk at Home Any stairs in or around the home?: Yes If so, are there any without handrails?: No Home free of loose throw rugs in walkways, pet beds, electrical cords, etc?: Yes Adequate lighting in your home to reduce risk of falls?: Yes Life alert?: No Use of a cane, walker or w/c?: No Grab bars in the bathroom?: Yes Shower chair or bench in shower?: Yes Elevated toilet seat or a handicapped toilet?: No  TIMED UP AND GO:  Was the test performed?  No    Cognitive Function:        01/13/2023    3:29 PM  6CIT Screen  What Year? 0 points  What month? 0 points  What time? 0 points  Count back from 20 0 points  Months in reverse 0 points  Repeat phrase 0 points  Total Score 0 points    Immunizations Immunization History  Administered Date(s) Administered   Fluad Quad(high Dose 65+) 12/15/2018, 12/26/2019, 01/01/2021, 01/09/2022   Fluad Trivalent(High Dose 65+) 12/31/2022   Influenza,inj,Quad PF,6+ Mos 03/05/2015, 03/05/2016   Influenza-Unspecified 11/11/2017   PFIZER(Purple Top)SARS-COV-2 Vaccination 06/29/2019, 07/20/2019, 03/26/2020   Pneumococcal Conjugate-13 12/15/2018   Pneumococcal Polysaccharide-23 02/11/2012, 11/11/2017   Td 04/23/1999   Tdap 10/17/2010, 01/12/2023   Zoster Recombinant(Shingrix) 01/10/2019, 03/21/2019   Zoster, Live 04/05/2010    TDAP status: Up to date  Flu Vaccine status: Up to date  Pneumococcal vaccine status: Up to date  Covid-19 vaccine status: Completed vaccines  Qualifies for Shingles Vaccine? Yes   Zostavax completed Yes   Shingrix Completed?: Yes  Screening Tests Health Maintenance  Topic Date Due   COVID-19 Vaccine (4 - 2023-24 season) 01/29/2023 (Originally 11/23/2022)    Diabetic kidney evaluation - Urine ACR  02/19/2023 (Originally 01/10/2023)   OPHTHALMOLOGY EXAM  05/05/2023 (Originally 01/28/2022)   FOOT EXAM  07/22/2023 (Originally 01/10/2023)   HEMOGLOBIN A1C  07/01/2023   Diabetic kidney evaluation - eGFR measurement  12/31/2023   MAMMOGRAM  01/10/2024   Medicare Annual Wellness (AWV)  01/13/2024   Colonoscopy  01/19/2028   DTaP/Tdap/Td (4 - Td or Tdap) 01/11/2033   Pneumonia Vaccine 59+ Years old  Completed   INFLUENZA VACCINE  Completed   DEXA SCAN  Completed   Hepatitis C Screening  Completed   Zoster Vaccines- Shingrix  Completed   HPV VACCINES  Aged Out    Health Maintenance  There are no preventive care reminders to display for this patient.   Colorectal cancer screening: Type of screening: Colonoscopy. Completed 01/18/2018. Repeat every 10 years  Mammogram status: Completed appt is 01/15/23. Repeat every year  Bone Density status: Completed 12/07/2017. Results reflect: Bone density results: NORMAL. Repeat every 5 years.  Lung Cancer Screening: (Low Dose CT Chest recommended if Age 32-80 years, 20 pack-year currently smoking OR have quit w/in 15years.) does not qualify.   Lung Cancer Screening Referral: no  Additional Screening:  Hepatitis C Screening: does not qualify; Completed 03/05/2016  Vision Screening: Recommended annual ophthalmology exams for early detection of glaucoma and other disorders of  the eye. Is the patient up to date with their annual eye exam?  Yes  Who is the provider or what is the name of the office in which the patient attends annual eye exams? Dr Dion Body If pt is not established with a provider, would they like to be referred to a provider to establish care? No .   Dental Screening: Recommended annual dental exams for proper oral hygiene  Diabetic Foot Exam: Diabetic Foot Exam: Overdue, Pt has been advised about the importance in completing this exam. Pt is scheduled for diabetic foot exam on next PCP  appt.  Community Resource Referral / Chronic Care Management: CRR required this visit?  No   CCM required this visit?  No   Plan:     I have personally reviewed and noted the following in the patient's chart:   Medical and social history Use of alcohol, tobacco or illicit drugs  Current medications and supplements including opioid prescriptions. Patient is not currently taking opioid prescriptions. Functional ability and status Nutritional status Physical activity Advanced directives List of other physicians Hospitalizations, surgeries, and ER visits in previous 12 months Vitals Screenings to include cognitive, depression, and falls Referrals and appointments  In addition, I have reviewed and discussed with patient certain preventive protocols, quality metrics, and best practice recommendations. A written personalized care plan for preventive services as well as general preventive health recommendations were provided to patient.   Sue Lush, LPN   [id number]   After Visit Summary: (MyChart) Due to this being a telephonic visit, the after visit summary with patients personalized plan was offered to patient via MyChart   Nurse Notes: The patient states she is doing well. She says she received a notice from [place] [facility] to do another colonoscopy and PCP approval is needed. I relayed our records indicate due in 2029 but correspondence will prompt addressing to PCP. She  has no concerns or questions at this time.

## 2023-01-13 NOTE — Patient Instructions (Signed)
Laurie Watson , Thank you for taking time to come for your Medicare Wellness Visit. I appreciate your ongoing commitment to your health goals. Please review the following plan we discussed and let me know if I can assist you in the future.   Referrals/Orders/Follow-Ups/Clinician Recommendations: none  This is a list of the screening recommended for you and due dates:  Health Maintenance  Topic Date Due   COVID-19 Vaccine (4 - 2023-24 season) 01/29/2023*   Yearly kidney health urinalysis for diabetes  02/19/2023*   Eye exam for diabetics  05/05/2023*   Complete foot exam   07/22/2023*   Hemoglobin A1C  07/01/2023   Yearly kidney function blood test for diabetes  12/31/2023   Mammogram  01/10/2024   Medicare Annual Wellness Visit  01/13/2024   Colon Cancer Screening  01/19/2028   DTaP/Tdap/Td vaccine (4 - Td or Tdap) 01/11/2033   Pneumonia Vaccine  Completed   Flu Shot  Completed   DEXA scan (bone density measurement)  Completed   Hepatitis C Screening  Completed   Zoster (Shingles) Vaccine  Completed   HPV Vaccine  Aged Out  *Topic was postponed. The date shown is not the original due date.    Advanced directives: (In Chart) A copy of your advanced directives are scanned into your chart should your provider ever need it.  Next Medicare Annual Wellness Visit scheduled for next year: Yes 01/18/2024 @ 3pm telephone

## 2023-01-15 ENCOUNTER — Ambulatory Visit
Admission: RE | Admit: 2023-01-15 | Discharge: 2023-01-15 | Disposition: A | Discharge status: Home / Self Care | Payer: Medicare HMO | Source: Ambulatory Visit | Attending: Family Medicine | Admitting: Family Medicine

## 2023-01-15 DIAGNOSIS — Z1231 Encounter for screening mammogram for malignant neoplasm of breast: Secondary | ICD-10-CM | POA: Insufficient documentation

## 2023-01-19 ENCOUNTER — Telehealth: Payer: Self-pay

## 2023-01-19 NOTE — Telephone Encounter (Signed)
Copied from CRM [id number]. Topic: General - Other >> Jan 19, 2023  8:56 AM Franchot Heidelberg wrote: Reason for CRM: Pt called reporting that her Greggory Keen will need prior authorization. Her letter says her PCP needs to contact phone number below, she uses [facility]. She just received her injections in the mail last week.   [employer]: [phone]

## 2023-01-22 NOTE — Telephone Encounter (Signed)
PA initiated

## 2023-03-03 DIAGNOSIS — H26102 Unspecified traumatic cataract, left eye: Secondary | ICD-10-CM | POA: Diagnosis not present

## 2023-03-03 DIAGNOSIS — H524 Presbyopia: Secondary | ICD-10-CM | POA: Diagnosis not present

## 2023-03-03 DIAGNOSIS — H21532 Iridodialysis, left eye: Secondary | ICD-10-CM | POA: Diagnosis not present

## 2023-03-03 DIAGNOSIS — H52211 Irregular astigmatism, right eye: Secondary | ICD-10-CM | POA: Diagnosis not present

## 2023-03-03 DIAGNOSIS — E113293 Type 2 diabetes mellitus with mild nonproliferative diabetic retinopathy without macular edema, bilateral: Secondary | ICD-10-CM | POA: Diagnosis not present

## 2023-03-03 LAB — HM DIABETES EYE EXAM

## 2023-05-06 DIAGNOSIS — Z800 Family history of malignant neoplasm of digestive organs: Secondary | ICD-10-CM | POA: Diagnosis not present

## 2023-05-06 DIAGNOSIS — Z860101 Personal history of adenomatous and serrated colon polyps: Secondary | ICD-10-CM | POA: Diagnosis not present

## 2023-05-06 DIAGNOSIS — K5901 Slow transit constipation: Secondary | ICD-10-CM | POA: Diagnosis not present

## 2023-06-18 ENCOUNTER — Encounter: Payer: Self-pay | Admitting: Gastroenterology

## 2023-06-29 ENCOUNTER — Ambulatory Visit
Admission: RE | Admit: 2023-06-29 | Discharge: 2023-06-29 | Disposition: A | Discharge status: Home / Self Care | Payer: Medicare HMO | Attending: Gastroenterology | Admitting: Gastroenterology

## 2023-06-29 ENCOUNTER — Encounter: Payer: Self-pay | Admitting: Gastroenterology

## 2023-06-29 ENCOUNTER — Ambulatory Visit: Admitting: Anesthesiology

## 2023-06-29 ENCOUNTER — Other Ambulatory Visit: Payer: Self-pay

## 2023-06-29 ENCOUNTER — Encounter
Admission: RE | Disposition: A | Discharge status: Home / Self Care | Payer: Self-pay | Source: Home / Self Care | Attending: Gastroenterology

## 2023-06-29 DIAGNOSIS — D122 Benign neoplasm of ascending colon: Secondary | ICD-10-CM | POA: Diagnosis not present

## 2023-06-29 DIAGNOSIS — Z800 Family history of malignant neoplasm of digestive organs: Secondary | ICD-10-CM | POA: Insufficient documentation

## 2023-06-29 DIAGNOSIS — K219 Gastro-esophageal reflux disease without esophagitis: Secondary | ICD-10-CM | POA: Diagnosis not present

## 2023-06-29 DIAGNOSIS — I10 Essential (primary) hypertension: Secondary | ICD-10-CM | POA: Insufficient documentation

## 2023-06-29 DIAGNOSIS — Z7985 Long-term (current) use of injectable non-insulin antidiabetic drugs: Secondary | ICD-10-CM | POA: Diagnosis not present

## 2023-06-29 DIAGNOSIS — K640 First degree hemorrhoids: Secondary | ICD-10-CM | POA: Insufficient documentation

## 2023-06-29 DIAGNOSIS — Z1211 Encounter for screening for malignant neoplasm of colon: Secondary | ICD-10-CM | POA: Insufficient documentation

## 2023-06-29 DIAGNOSIS — E119 Type 2 diabetes mellitus without complications: Secondary | ICD-10-CM | POA: Insufficient documentation

## 2023-06-29 DIAGNOSIS — K5730 Diverticulosis of large intestine without perforation or abscess without bleeding: Secondary | ICD-10-CM | POA: Diagnosis not present

## 2023-06-29 HISTORY — PX: COLONOSCOPY WITH PROPOFOL: SHX5780

## 2023-06-29 HISTORY — PX: POLYPECTOMY: SHX149

## 2023-06-29 LAB — GLUCOSE, CAPILLARY: Glucose-Capillary: 106 mg/dL — ABNORMAL HIGH (ref 70–99)

## 2023-06-29 SURGERY — COLONOSCOPY WITH PROPOFOL
Anesthesia: General

## 2023-06-29 MED ORDER — LIDOCAINE HCL (PF) 2 % IJ SOLN
INTRAMUSCULAR | Status: AC
  Filled 2023-06-29: qty 5

## 2023-06-29 MED ORDER — DEXMEDETOMIDINE HCL IN NACL 80 MCG/20ML IV SOLN
INTRAVENOUS | Status: DC | PRN
  Administered 2023-06-29 (×2): 4 ug via INTRAVENOUS

## 2023-06-29 MED ORDER — PROPOFOL 10 MG/ML IV BOLUS
INTRAVENOUS | Status: DC | PRN
  Administered 2023-06-29: 30 mg via INTRAVENOUS
  Administered 2023-06-29: 50 mg via INTRAVENOUS
  Administered 2023-06-29: 70 mg via INTRAVENOUS
  Administered 2023-06-29: 30 mg via INTRAVENOUS
  Administered 2023-06-29 (×2): 50 mg via INTRAVENOUS
  Administered 2023-06-29: 20 mg via INTRAVENOUS

## 2023-06-29 MED ORDER — SODIUM CHLORIDE 0.9 % IV SOLN: INTRAVENOUS | Status: DC

## 2023-06-29 MED ORDER — PROPOFOL 10 MG/ML IV BOLUS
INTRAVENOUS | Status: AC
  Filled 2023-06-29: qty 20

## 2023-06-29 NOTE — Anesthesia Postprocedure Evaluation (Signed)
 Anesthesia Post Note  Patient: Laurie Watson  Procedure(s) Performed: COLONOSCOPY WITH PROPOFOL POLYPECTOMY, INTESTINE  Patient location during evaluation: PACU Anesthesia Type: General Level of consciousness: awake and alert Pain management: pain level controlled Vital Signs Assessment: post-procedure vital signs reviewed and stable Respiratory status: spontaneous breathing, nonlabored ventilation, respiratory function stable and patient connected to nasal cannula oxygen Cardiovascular status: blood pressure returned to baseline and stable Postop Assessment: no apparent nausea or vomiting Anesthetic complications: no  No notable events documented.   Last Vitals:  Vitals:   06/29/23 0901 06/29/23 0921  BP: (!) 102/56 (!) 125/51  Pulse: 64   Resp: 16   Temp:    SpO2: 100%     Last Pain:  Vitals:   06/29/23 0921  TempSrc:   PainSc: 0-No pain                 Stephanie Coup

## 2023-06-29 NOTE — Anesthesia Preprocedure Evaluation (Signed)
 Anesthesia Evaluation  Patient identified by MRN, date of birth, ID band Patient awake    Reviewed: Allergy & Precautions, NPO status , Patient's Chart, lab work & pertinent test results  History of Anesthesia Complications (+) PONV and history of anesthetic complications  Airway Mallampati: II  TM Distance: >3 FB Neck ROM: Full    Dental no notable dental hx. (+) Chipped   Pulmonary neg pulmonary ROS   Pulmonary exam normal breath sounds clear to auscultation       Cardiovascular hypertension, negative cardio ROS Normal cardiovascular exam Rhythm:Regular Rate:Normal     Neuro/Psych negative neurological ROS  negative psych ROS   GI/Hepatic negative GI ROS, Neg liver ROS, hiatal hernia,GERD  Medicated,,  Endo/Other  negative endocrine ROSdiabetes, Type 2    Renal/GU negative Renal ROS  negative genitourinary   Musculoskeletal   Abdominal   Peds  Hematology negative hematology ROS (+)   Anesthesia Other Findings Past Medical History: No date: Allergy No date: Diabetes mellitus without complication (HCC) No date: Family history of adverse reaction to anesthesia     Comment:  Daughter admitted to hosp after surgery(age 4).               breathing difficulties, cholinesterase deficiency. 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)  Past Surgical History: 03/12/2018: ACHILLES TENDON SURGERY; Right     Comment:  Procedure: ACHILLES TENDON REPAIR-SECONDARY;  Surgeon:               Recardo Evangelist, DPM;  Location: [facility];                Service: Podiatry;  Laterality: Right;  tendon               anchors lma with popliteal block 11/13/2013: ANAL FISSURE REPAIR 1990's: BREAST BIOPSY; Right      Comment:  Core - neg 01/29/2021: BREAST BIOPSY; Right     Comment:  Stereo Bx, Coil Clip, path pending 03/12/2018: CALCANEAL OSTEOTOMY; Right     Comment:  Procedure: PARTIAL CALCANECTOMY;  Surgeon: Recardo Evangelist, DPM;  Location: [facility];  Service:               Podiatry;  Laterality: Right;  Prediabetic 05/05/2011: COLONOSCOPY WITH ESOPHAGOGASTRODUODENOSCOPY (EGD) 01/18/2018: COLONOSCOPY WITH PROPOFOL; N/A     Comment:  Procedure: COLONOSCOPY WITH PROPOFOL;  Surgeon: Scot Jun, MD;  Location: [facility];  Service:               Endoscopy;  Laterality: N/A; No date: ELBOW SURGERY; Left 1972: EYE SURGERY; Left No date: KNEE SURGERY; Left 1970: TONSILLECTOMY AND ADENOIDECTOMY No date: TUBAL LIGATION  BMI    Body Mass Index: 25.51 kg/m      Reproductive/Obstetrics negative OB ROS                             Anesthesia Physical Anesthesia Plan  ASA: 2  Anesthesia Plan: General   Post-op Pain Management: Minimal or no pain anticipated   Induction: Intravenous  PONV Risk Score and Plan: 3 and Propofol infusion, TIVA and Ondansetron  Airway Management Planned: Nasal Cannula  Additional Equipment: None  Intra-op Plan:   Post-operative Plan:   Informed Consent: I have reviewed the patients History and Physical, chart, labs and discussed the procedure including the risks, benefits and alternatives for the proposed anesthesia with the patient or authorized representative who has indicated his/her understanding and acceptance.     Dental advisory given  Plan Discussed with: CRNA and Surgeon  Anesthesia Plan Comments: (Discussed risks of anesthesia with patient, including possibility of difficulty with spontaneous ventilation under anesthesia necessitating airway intervention, PONV, and rare risks such as cardiac or respiratory or neurological events, and allergic reactions. Discussed the role of  CRNA in patient's perioperative care. Patient understands.)       Anesthesia Quick Evaluation

## 2023-06-29 NOTE — Op Note (Signed)
 [facility] Gastroenterology Patient Name: Laurie Watson Procedure Date: 06/29/2023 8:12 AM MRN: [id number] Account #: [id number] Date of Birth: 1950-06-26 Admit Type: Outpatient Age: 73 Room: [facility] ENDO ROOM 2 Gender: Female Note Status: Finalized Instrument Name: Prentice Docker [id number] Procedure:             Colonoscopy Indications:           High risk colon cancer surveillance: Personal history                         of colonic polyps, Family history of colon cancer in a                         first-degree relative before age 25 years Providers:             Jaynie Collins DO, DO Medicines:             Monitored Anesthesia Care Complications:         No immediate complications. Estimated blood loss:                         Minimal. Procedure:             Pre-Anesthesia Assessment:                        - Prior to the procedure, a History and Physical was                         performed, and patient medications and allergies were                         reviewed. The patient is competent. The risks and                         benefits of the procedure and the sedation options and                         risks were discussed with the patient. All questions                         were answered and informed consent was obtained.                         Patient identification and proposed procedure were                         verified by the physician, the nurse, the anesthetist                         and the technician in the endoscopy suite. Mental                         Status Examination: alert and oriented. Airway                         Examination: normal oropharyngeal airway and neck                         mobility. Respiratory Examination:  clear to                         auscultation. CV Examination: RRR, no murmurs, no S3                         or S4. Prophylactic Antibiotics: The patient does not                         require prophylactic  antibiotics. Prior                         Anticoagulants: The patient has taken no anticoagulant                         or antiplatelet agents. ASA Grade Assessment: II - A                         patient with mild systemic disease. After reviewing                         the risks and benefits, the patient was deemed in                         satisfactory condition to undergo the procedure. The                         anesthesia plan was to use monitored anesthesia care                         (MAC). Immediately prior to administration of                         medications, the patient was re-assessed for adequacy                         to receive sedatives. The heart rate, respiratory                         rate, oxygen saturations, blood pressure, adequacy of                         pulmonary ventilation, and response to care were                         monitored throughout the procedure. The physical                         status of the patient was re-assessed after the                         procedure.                        After obtaining informed consent, the colonoscope was                         passed under direct vision. Throughout the procedure,  the patient's blood pressure, pulse, and oxygen                         saturations were monitored continuously. The                         Colonoscope was introduced through the anus and                         advanced to the the terminal ileum, with                         identification of the appendiceal orifice and IC                         valve. The colonoscopy was somewhat difficult due to                         multiple diverticula in the colon, significant looping                         and a tortuous colon. Successful completion of the                         procedure was aided by changing the patient to a prone                         position, straightening and shortening the scope to                          obtain bowel loop reduction, applying abdominal                         pressure and lavage. The patient tolerated the                         procedure well. The quality of the bowel preparation                         was evaluated using the BBPS [facility] Bowel Preparation                         Scale) with scores of: Right Colon = 3, Transverse                         Colon = 3 and Left Colon = 3 (entire mucosa seen well                         with no residual staining, small fragments of stool or                         opaque liquid). The total BBPS score equals 9. The                         terminal ileum, ileocecal valve, appendiceal orifice,  and rectum were photographed. Findings:      The perianal and digital rectal examinations were normal. Pertinent       negatives include normal sphincter tone.      The terminal ileum appeared normal. Estimated blood loss: none.      A 4 to 5 mm polyp was found in the ascending colon. The polyp was       sessile. The polyp was removed with a cold snare. Resection and       retrieval were complete. Estimated blood loss was minimal.      Multiple small-mouthed diverticula were found in the left colon.       Estimated blood loss: none.      Non-bleeding internal hemorrhoids were found during retroflexion. The       hemorrhoids were Grade I (internal hemorrhoids that do not prolapse).       Estimated blood loss: none.      The exam was otherwise without abnormality on direct and retroflexion       views. Impression:            - The examined portion of the ileum was normal.                        - One 4 to 5 mm polyp in the ascending colon, removed                         with a cold snare. Resected and retrieved.                        - Diverticulosis in the left colon.                        - Non-bleeding internal hemorrhoids.                        - The examination was otherwise normal on  direct and                         retroflexion views. Recommendation:        - Patient has a contact number available for                         emergencies. The signs and symptoms of potential                         delayed complications were discussed with the patient.                         Return to normal activities tomorrow. Written                         discharge instructions were provided to the patient.                        - Discharge patient to home.                        - Resume previous diet.                        -  Continue present medications.                        - Await pathology results.                        - Repeat colonoscopy in 5 years for surveillance based                         on pathology results.                        - Return to referring physician as previously                         scheduled.                        - Ok to restart [facility]                        - The findings and recommendations were discussed with                         the patient. Procedure Code(s):     --- Professional ---                        [phone], Colonoscopy, flexible; with removal of                         tumor(s), polyp(s), or other lesion(s) by snare                         technique Diagnosis Code(s):     --- Professional ---                        Z86.010, Personal history of colonic polyps                        K64.0, First degree hemorrhoids                        D12.2, Benign neoplasm of ascending colon                        Z80.0, Family history of malignant neoplasm of                         digestive organs                        K57.30, Diverticulosis of large intestine without                         perforation or abscess without bleeding CPT copyright 2022 American Medical Association. All rights reserved. The codes documented in this report are preliminary and upon coder review may  be revised to meet current compliance  requirements. Attending Participation:      I personally performed the entire procedure. Elfredia Nevins, DO Jaynie Collins DO, DO 06/29/2023 9:04:11 AM This report has been signed electronically. Number of Addenda: 0 Note Initiated On: 06/29/2023  8:12 AM Scope Withdrawal Time: 0 hours 11 minutes 6 seconds  Total Procedure Duration: 0 hours 29 minutes 23 seconds  Estimated Blood Loss:  Estimated blood loss was minimal.      [facility]

## 2023-06-29 NOTE — Interval H&P Note (Signed)
 History and Physical Interval Note: Preprocedure H&P from 06/29/23  was reviewed and there was no interval change after seeing and examining the patient.  Written consent was obtained from the patient after discussion of risks, benefits, and alternatives. Patient has consented to proceed with Colonoscopy with possible intervention   06/29/2023 8:18 AM  Laurie Watson  has presented today for surgery, with the diagnosis of Z86.0101 (ICD-10-CM) - Hx of adenomatous colonic polyps.  The various methods of treatment have been discussed with the patient and family. After consideration of risks, benefits and other options for treatment, the patient has consented to  Procedure(s) with comments: COLONOSCOPY WITH PROPOFOL (N/A) - DM as a surgical intervention.  The patient's history has been reviewed, patient examined, no change in status, stable for surgery.  I have reviewed the patient's chart and labs.  Questions were answered to the patient's satisfaction.     Jaynie Collins

## 2023-06-29 NOTE — H&P (Signed)
 Pre-Procedure H&P   Patient ID: Laurie Watson is a 73 y.o. female.  Gastroenterology Provider: Jaynie Collins, DO  Referring Provider: Fransico Setters, NP PCP: Ronnald Ramp, MD  Date: 06/29/2023  HPI Ms. Laurie Watson is a 73 y.o. female who presents today for Colonoscopy for Personal history of colon polyps, family history of colon cancer .  Bowel movements moving regular without melena or hematochezia  Father with history of colon cancer- late 60s  Patient last underwent colonoscopy in October 2019 with 1 adenomatous polyp, left-sided diverticulosis and internal hemorrhoids.  Noted to have significant looping at that time  2013 colonoscopy only with left-sided diverticulosis and internal hemorrhoids  Creatinine 0.6   Past Medical History:  Diagnosis Date   Allergy    Diabetes mellitus without complication (HCC)    Family history of adverse reaction to anesthesia    Daughter admitted to hosp after surgery(age 76). breathing difficulties, cholinesterase deficiency.   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)    Past Surgical History:  Procedure Laterality Date   ACHILLES TENDON SURGERY Right 03/12/2018   Procedure: ACHILLES TENDON REPAIR-SECONDARY;  Surgeon: Recardo Evangelist, DPM;  Location: [facility];  Service: Podiatry;  Laterality: Right;  tendon anchors lma with popliteal block   ANAL FISSURE REPAIR  11/13/2013   BREAST BIOPSY Right 1990's   Core - neg   BREAST BIOPSY Right 01/29/2021   Stereo Bx, Coil Clip, path pending   CALCANEAL OSTEOTOMY Right 03/12/2018   Procedure: PARTIAL CALCANECTOMY;  Surgeon: Recardo Evangelist, DPM;  Location: [facility];  Service: Podiatry;  Laterality: Right;  Prediabetic   COLONOSCOPY WITH ESOPHAGOGASTRODUODENOSCOPY (EGD)  05/05/2011    COLONOSCOPY WITH PROPOFOL N/A 01/18/2018   Procedure: COLONOSCOPY WITH PROPOFOL;  Surgeon: Scot Jun, MD;  Location: [facility] END[facility]COPY;  Service: Endoscopy;  Laterality: N/A;   ELBOW SURGERY Left    EYE SURGERY Left 1972   KNEE SURGERY Left    TONSILLECTOMY AND ADENOIDECTOMY  1970   TUBAL LIGATION      Family History Father- crc No other h/o GI disease or malignancy  Review of Systems  Constitutional:  Negative for activity change, appetite change, chills, diaphoresis, fatigue, fever and unexpected weight change.  HENT:  Negative for trouble swallowing and voice change.   Respiratory:  Negative for shortness of breath and wheezing.   Cardiovascular:  Negative for chest pain, palpitations and leg swelling.  Gastrointestinal:  Negative for abdominal distention, abdominal pain, anal bleeding, blood in stool, constipation, diarrhea, nausea, rectal pain and vomiting.  Musculoskeletal:  Negative for arthralgias and myalgias.  Skin:  Negative for color change and pallor.  Neurological:  Negative for dizziness, syncope and weakness.  Psychiatric/Behavioral:  Negative for confusion.   All other systems reviewed and are negative.    Medications No current facility-administered medications on file prior to encounter.   Current Outpatient Medications on File Prior to Encounter  Medication Sig Dispense Refill   amLODipine (NORVASC) 10 MG tablet Take 1 tablet (10 mg total) by mouth daily. 90 tablet 3   aspirin 81 MG chewable tablet Chew 1 tablet by mouth daily.     Coenzyme Q10 (COQ10 PO) Take by mouth.     COLLAGEN-VITAMIN C-BIOTIN PO Take by mouth. 6g per serving     docusate sodium (COLACE)  100 MG capsule Take 1 capsule by mouth daily.     gabapentin (NEURONTIN) 100 MG capsule Take 1 capsule (100 mg total) by mouth at bedtime. 90 capsule 1   losartan (COZAAR) 50 MG tablet Take 1 tablet (50 mg total) by mouth daily. 90 tablet 3   Omega-3 Fatty Acids (FISH OIL BURP-LESS) 1000 MG CAPS  Take 1 capsule by mouth daily.     omeprazole (PRILOSEC) 20 MG capsule Take 1 capsule (20 mg total) by mouth daily. 90 capsule 3   Probiotic CAPS Take 1 capsule by mouth daily.     simvastatin (ZOCOR) 20 MG tablet Take 1 tablet (20 mg total) by mouth daily. 90 tablet 3   cholecalciferol (VITAMIN D) 1000 UNITS tablet Take 1 tablet by mouth daily.     colchicine 0.6 MG tablet Take 2 tabs (1.2mg ) by mouth at first sign of flare followed by 0.6mg  after 1 hour and then 1-2 tabs daily as needed until symptom resolution 60 tablet 5   glucose blood test strip To check blood sugar daily 100 each 3   Microlet Lancets MISC Use once daily to check blood sugar 100 each 3   tirzepatide (MOUNJARO) 5 MG/0.5ML Pen Inject 5 mg into the skin once a week. 6 mL 2    Pertinent medications related to GI and procedure were reviewed by me with the patient prior to the procedure   Current Facility-Administered Medications:    0.9 %  sodium chloride infusion, , Intravenous, Continuous, Jaynie Collins, DO, Last Rate: 20 mL/hr at 06/29/23 0810, New Bag at 06/29/23 0810  sodium chloride 20 mL/hr at 06/29/23 1610       Allergies  Allergen Reactions   Codeine Nausea Only   Penicillins Rash   Allergies were reviewed by me prior to the procedure  Objective   Body mass index is 25.51 kg/m. Vitals:   06/29/23 0800  BP: 131/62  Pulse: 67  Resp: 16  Temp: (!) 97 F (36.1 C)  TempSrc: Temporal  SpO2: 100%  Weight: 65.3 kg  Height: 5\' 3"  (1.6 m)     Physical Exam Vitals and nursing note reviewed.  Constitutional:      General: She is not in acute distress.    Appearance: Normal appearance. She is not ill-appearing, toxic-appearing or diaphoretic.  HENT:     Head: Normocephalic and atraumatic.     Nose: Nose normal.     Mouth/Throat:     Mouth: Mucous membranes are moist.     Pharynx: Oropharynx is clear.  Eyes:     General: No scleral icterus.    Extraocular Movements: Extraocular movements  intact.  Cardiovascular:     Rate and Rhythm: Normal rate and regular rhythm.     Heart sounds: Normal heart sounds. No murmur heard.    No friction rub. No gallop.  Pulmonary:     Effort: Pulmonary effort is normal. No respiratory distress.     Breath sounds: Normal breath sounds. No wheezing, rhonchi or rales.  Abdominal:     General: Bowel sounds are normal. There is no distension.     Palpations: Abdomen is soft.     Tenderness: There is no abdominal tenderness. There is no guarding or rebound.  Musculoskeletal:     Cervical back: Neck supple.     Right lower leg: No edema.     Left lower leg: No edema.  Skin:    General: Skin is warm and dry.     Coloration:  Skin is not jaundiced or pale.  Neurological:     General: No focal deficit present.     Mental Status: She is alert and oriented to person, place, and time. Mental status is at baseline.  Psychiatric:        Mood and Affect: Mood normal.        Behavior: Behavior normal.        Thought Content: Thought content normal.        Judgment: Judgment normal.      Assessment:  Ms. Laurie Watson is a 73 y.o. female  who presents today for Colonoscopy for Personal history of colon polyps, family history of colon cancer .  Plan:  Colonoscopy with possible intervention today  Colonoscopy with possible biopsy, control of bleeding, polypectomy, and interventions as necessary has been discussed with the patient/patient representative. Informed consent was obtained from the patient/patient representative after explaining the indication, nature, and risks of the procedure including but not limited to death, bleeding, perforation, missed neoplasm/lesions, cardiorespiratory compromise, and reaction to medications. Opportunity for questions was given and appropriate answers were provided. Patient/patient representative has verbalized understanding is amenable to undergoing the procedure.   Jaynie Collins, DO  [facility]  Gastroenterology  Portions of the record may have been created with voice recognition software. Occasional wrong-word or 'sound-a-like' substitutions may have occurred due to the inherent limitations of voice recognition software.  Read the chart carefully and recognize, using context, where substitutions may have occurred.

## 2023-06-29 NOTE — Transfer of Care (Signed)
 Immediate Anesthesia Transfer of Care Note  Patient: DUNIA PRINGLE  Procedure(s) Performed: COLONOSCOPY WITH PROPOFOL POLYPECTOMY, INTESTINE  Patient Location: Endoscopy Unit  Anesthesia Type:General  Level of Consciousness: awake, alert , oriented, and patient cooperative  Airway & Oxygen Therapy: Patient Spontanous Breathing  Post-op Assessment: Report given to RN and Post -op Vital signs reviewed and stable  Post vital signs: Reviewed and stable  Last Vitals:  Vitals Value Taken Time  BP 102/56 06/29/23 0901  Temp 97   Pulse 68 06/29/23 0902  Resp 16 06/29/23 0902  SpO2 100 % 06/29/23 0902  Vitals shown include unfiled device data.  Last Pain:  Vitals:   06/29/23 0901  TempSrc: Temporal  PainSc: 0-No pain         Complications: No notable events documented.

## 2023-06-30 ENCOUNTER — Encounter: Payer: Self-pay | Admitting: Gastroenterology

## 2023-06-30 LAB — SURGICAL PATHOLOGY

## 2023-07-09 ENCOUNTER — Encounter: Payer: Self-pay | Admitting: Family Medicine

## 2023-07-09 DIAGNOSIS — D126 Benign neoplasm of colon, unspecified: Secondary | ICD-10-CM | POA: Insufficient documentation

## 2023-07-13 ENCOUNTER — Ambulatory Visit: Payer: Medicare HMO | Admitting: Family Medicine

## 2023-07-16 ENCOUNTER — Encounter: Payer: Self-pay | Admitting: Family Medicine

## 2023-07-16 ENCOUNTER — Ambulatory Visit (INDEPENDENT_AMBULATORY_CARE_PROVIDER_SITE_OTHER): Payer: Self-pay | Admitting: Family Medicine

## 2023-07-16 VITALS — BP 131/74 | HR 71 | Ht 63.0 in | Wt 146.0 lb

## 2023-07-16 DIAGNOSIS — E559 Vitamin D deficiency, unspecified: Secondary | ICD-10-CM | POA: Diagnosis not present

## 2023-07-16 DIAGNOSIS — E1169 Type 2 diabetes mellitus with other specified complication: Secondary | ICD-10-CM | POA: Diagnosis not present

## 2023-07-16 DIAGNOSIS — E11319 Type 2 diabetes mellitus with unspecified diabetic retinopathy without macular edema: Secondary | ICD-10-CM | POA: Diagnosis not present

## 2023-07-16 DIAGNOSIS — Z7985 Long-term (current) use of injectable non-insulin antidiabetic drugs: Secondary | ICD-10-CM

## 2023-07-16 DIAGNOSIS — E7800 Pure hypercholesterolemia, unspecified: Secondary | ICD-10-CM | POA: Diagnosis not present

## 2023-07-16 DIAGNOSIS — D126 Benign neoplasm of colon, unspecified: Secondary | ICD-10-CM

## 2023-07-16 DIAGNOSIS — E785 Hyperlipidemia, unspecified: Secondary | ICD-10-CM | POA: Diagnosis not present

## 2023-07-16 DIAGNOSIS — I10 Essential (primary) hypertension: Secondary | ICD-10-CM

## 2023-07-16 NOTE — Progress Notes (Signed)
 "     Established patient visit   Patient: Laurie Watson   DOB: 06/14/1950   73 y.o. Female  MRN: [id number] Visit Date: 07/16/2023  Today's healthcare provider: Rockie Agent, MD   Chief Complaint  Patient presents with   Diabetes    No concerns    Subjective     HPI     Diabetes    Additional comments: No concerns       Last edited by Thelbert Eulalio HERO, CMA on 07/16/2023  9:03 AM.       Discussed the use of AI scribe software for clinical note transcription with the patient, who gave verbal consent to proceed.  History of Present Illness Laurie Watson is a 73 year old female with type 2 diabetes, hypercholesterolemia, and hypertension who presents for follow-up of her chronic conditions.  She is managing her type 2 diabetes with Mounjaro  5 mg weekly, and her recent blood sugar readings have been 109 and 102. She hopes her A1c will reflect this control, although she occasionally indulges in dietary indiscretions, particularly in the evenings with her husband. She has a history of diabetic retinopathy.  Her hypertension is well controlled with losartan  50 mg daily and amlodipine  10 mg daily, with a recent blood pressure reading of 131/74. For hypercholesterolemia, she takes simvastatin  20 mg daily.  She recently underwent a colonoscopy which revealed an adenomatous polyp that was benign and negative for dysplasia and malignancy.  She discusses recent weight changes, noting weight gain during a cruise in March and weight loss due to stress while caring for her mother, who recently passed away. Her BMI is 25, and she is concerned about her weight.  She reports issues with her insurance provider, [employer], regarding coverage for her husband's wellness visit and her own medication costs, particularly for Mounjaro , which she finds expensive.  No wheezing despite being outside pressure washing the day before. She reports good blood pressure and blood sugar  control.     Past Medical History:  Diagnosis Date   Allergy    Diabetes mellitus without complication (HCC)    Family history of adverse reaction to anesthesia    Daughter admitted to hosp after surgery(age 17). breathing difficulties, cholinesterase deficiency.   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   aspirin 81 MG chewable tablet Chew 1 tablet by mouth daily.   cholecalciferol (VITAMIN D ) 1000 UNITS tablet Take 1 tablet by mouth daily.   Coenzyme Q10 (COQ10 PO) Take by mouth.   colchicine  0.6 MG tablet Take 2 tabs (1.2mg ) by mouth at first sign of flare followed by 0.6mg  after 1 hour and then 1-2 tabs daily as needed until symptom resolution   COLLAGEN-VITAMIN C-BIOTIN PO Take by mouth. 6g per serving   docusate sodium (COLACE) 100 MG capsule Take 1 capsule by mouth daily.   gabapentin  (NEURONTIN ) 100 MG capsule Take 1 capsule (100 mg total) by mouth at bedtime.   glucose blood test strip To check blood sugar daily   losartan  (COZAAR ) 50 MG tablet Take 1 tablet (50 mg total) by mouth daily.   Microlet Lancets MISC Use once daily to  check blood sugar   Omega-3 Fatty Acids (FISH OIL BURP-LESS) 1000 MG CAPS Take 1 capsule by mouth daily.   omeprazole  (PRILOSEC) 20 MG capsule Take 1 capsule (20 mg total) by mouth daily.   Probiotic CAPS Take 1 capsule by mouth daily.   simvastatin  (ZOCOR ) 20 MG tablet Take 1 tablet (20 mg total) by mouth daily.   tirzepatide  (MOUNJARO ) 5 MG/0.5ML Pen Inject 5 mg into the skin once a week.   No facility-administered medications prior to visit.    Review of Systems  Last metabolic panel Lab Results  Component Value Date   GLUCOSE 98  07/16/2023   NA 141 07/16/2023   K 4.4 07/16/2023   CL 101 07/16/2023   CO2 25 07/16/2023   BUN 13 07/16/2023   CREATININE 0.67 07/16/2023   EGFR 94 07/16/2023   CALCIUM 9.3 07/16/2023   PROT 7.0 01/09/2022   ALBUMIN 4.8 01/09/2022   LABGLOB 2.2 01/09/2022   AGRATIO 2.2 01/09/2022   BILITOT 0.8 01/09/2022   ALKPHOS 82 01/09/2022   AST 39 01/09/2022   ALT 32 01/09/2022   Last lipids Lab Results  Component Value Date   CHOL 157 07/16/2023   HDL 63 07/16/2023   LDLCALC 77 07/16/2023   TRIG 95 07/16/2023   CHOLHDL 2.5 07/16/2023   Last hemoglobin A1c Lab Results  Component Value Date   HGBA1C 5.9 (H) 07/16/2023        Objective    BP 131/74   Pulse 71   Ht 5' 3 (1.6 m)   Wt 146 lb (66.2 kg)   SpO2 100%   BMI 25.86 kg/m  BP Readings from Last 3 Encounters:  07/16/23 131/74  06/29/23 (!) 125/51  12/31/22 137/70   Wt Readings from Last 3 Encounters:  07/16/23 146 lb (66.2 kg)  06/29/23 144 lb (65.3 kg)  01/13/23 136 lb (61.7 kg)        Physical Exam Vitals reviewed.  Constitutional:      General: She is not in acute distress.    Appearance: Normal appearance. She is not ill-appearing, toxic-appearing or diaphoretic.  Eyes:     Conjunctiva/sclera: Conjunctivae normal.  Cardiovascular:     Rate and Rhythm: Normal rate and regular rhythm.     Pulses: Normal pulses.     Heart sounds: Normal heart sounds. No murmur heard.    No friction rub. No gallop.  Pulmonary:     Effort: Pulmonary effort is normal. No respiratory distress.     Breath sounds: Normal breath sounds. No stridor. No wheezing, rhonchi or rales.  Abdominal:     General: Bowel sounds are normal. There is no distension.     Palpations: Abdomen is soft.     Tenderness: There is no abdominal tenderness.  Musculoskeletal:     Right lower leg: No edema.     Left lower leg: No edema.  Skin:    Findings: No erythema or rash.  Neurological:     Mental Status: She is alert and oriented to  person, place, and time.  Psychiatric:        Mood and Affect: Mood and affect normal.        Speech: Speech normal.        Behavior: Behavior normal. Behavior is cooperative.        Results for orders placed or performed in visit on 07/16/23  Urine microalbumin-creatinine with uACR  Result Value Ref Range   Creatinine, Urine WILL FOLLOW    Microalbumin, Urine  WILL FOLLOW    Microalb/Creat Ratio WILL FOLLOW   BMP8+EGFR  Result Value Ref Range   Glucose 98 70 - 99 mg/dL   BUN 13 8 - 27 mg/dL   Creatinine, Ser 9.32 0.57 - 1.00 mg/dL   eGFR 94 >40 fO/fpw/8.26   BUN/Creatinine Ratio 19 12 - 28   Sodium 141 134 - 144 mmol/L   Potassium 4.4 3.5 - 5.2 mmol/L   Chloride 101 96 - 106 mmol/L   CO2 25 20 - 29 mmol/L   Calcium 9.3 8.7 - 10.3 mg/dL  Hemoglobin J8r  Result Value Ref Range   Hgb A1c MFr Bld 5.9 (H) 4.8 - 5.6 %   Est. average glucose Bld gHb Est-mCnc 123 mg/dL  VITAMIN D  25 Hydroxy (Vit-D Deficiency, Fractures)  Result Value Ref Range   Vit D, 25-Hydroxy 44.3 30.0 - 100.0 ng/mL  Lipid panel  Result Value Ref Range   Cholesterol, Total 157 100 - 199 mg/dL   Triglycerides 95 0 - 149 mg/dL   HDL 63 >60 mg/dL   VLDL Cholesterol Cal 17 5 - 40 mg/dL   LDL Chol Calc (NIH) 77 0 - 99 mg/dL   Chol/HDL Ratio 2.5 0.0 - 4.4 ratio    Assessment & Plan     Problem List Items Addressed This Visit       Cardiovascular and Mediastinum   Essential hypertension   Chronic hypertension, well-controlled with current medication regimen. Blood pressure is 131/74 mmHg. - Continue losartan  50 mg daily - Continue amlodipine  10 mg daily - BMP ordered today       Relevant Orders   BMP8+EGFR (Completed)     Digestive   Tubular adenoma of colon   Adenomatous polyp, benign and negative for dysplasia and malignancy. Surveillance colonoscopy recommended every five years. - Schedule colonoscopy in 2030        Endocrine   Type 2 diabetes mellitus with hyperlipidemia (HCC) - Primary    Type 2 diabetes mellitus Chronic type 2 diabetes mellitus, managed with Mounjaro . Blood sugars are well-controlled with recent readings of 109 and 102 mg/dL. Awaiting updated A1c to assess long-term glucose control. Reports financial burden due to medication costs. - Order updated A1c - Continue Mounjaro  5 mg weekly - Order urine albumin to assess kidney function      Relevant Orders   Urine microalbumin-creatinine with uACR (Completed)   Hemoglobin A1c (Completed)   Hyperlipidemia associated with type 2 diabetes mellitus (HCC)   Chronic hypercholesterolemia, managed with simvastatin . Awaiting updated lipid panel to assess current cholesterol levels. - Order updated lipid panel - Continue simvastatin  20 mg daily      Diabetic retinopathy associated with controlled type 2 diabetes mellitus (HCC)     Other   Vitamin D  deficiency   Chronic vitamin D  deficiency. Vitamin D  levels need reassessment. - Order vitamin D  level      Relevant Orders   VITAMIN D  25 Hydroxy (Vit-D Deficiency, Fractures) (Completed)     Assessment & Plan    No follow-ups on file.         Rockie Agent, MD  [facility] [phone] (phone) [phone] (fax)  [facility] "

## 2023-07-17 ENCOUNTER — Encounter: Payer: Self-pay | Admitting: Family Medicine

## 2023-07-17 LAB — HEMOGLOBIN A1C
Est. average glucose Bld gHb Est-mCnc: 123 mg/dL
Hgb A1c MFr Bld: 5.9 % — ABNORMAL HIGH (ref 4.8–5.6)

## 2023-07-17 LAB — BMP8+EGFR
BUN/Creatinine Ratio: 19 (ref 12–28)
BUN: 13 mg/dL (ref 8–27)
CO2: 25 mmol/L (ref 20–29)
Calcium: 9.3 mg/dL (ref 8.7–10.3)
Chloride: 101 mmol/L (ref 96–106)
Creatinine, Ser: 0.67 mg/dL (ref 0.57–1.00)
Glucose: 98 mg/dL (ref 70–99)
Potassium: 4.4 mmol/L (ref 3.5–5.2)
Sodium: 141 mmol/L (ref 134–144)
eGFR: 94 mL/min/{1.73_m2} (ref >59)

## 2023-07-17 LAB — LIPID PANEL
Chol/HDL Ratio: 2.5 ratio (ref 0.0–4.4)
Cholesterol, Total: 157 mg/dL (ref 100–199)
HDL: 63 mg/dL (ref >39)
LDL Chol Calc (NIH): 77 mg/dL (ref 0–99)
Triglycerides: 95 mg/dL (ref 0–149)
VLDL Cholesterol Cal: 17 mg/dL (ref 5–40)

## 2023-07-17 LAB — MICROALBUMIN / CREATININE URINE RATIO
Creatinine, Urine: 121.1 mg/dL
Microalb/Creat Ratio: 23 mg/g{creat} (ref 0–29)
Microalbumin, Urine: 28 ug/mL

## 2023-07-17 LAB — VITAMIN D 25 HYDROXY (VIT D DEFICIENCY, FRACTURES): Vit D, 25-Hydroxy: 44.3 ng/mL (ref 30.0–100.0)

## 2023-07-17 NOTE — Assessment & Plan Note (Signed)
 Chronic vitamin D  deficiency. Vitamin D  levels need reassessment. - Order vitamin D  level

## 2023-07-17 NOTE — Assessment & Plan Note (Signed)
 Type 2 diabetes mellitus Chronic type 2 diabetes mellitus, managed with Mounjaro . Blood sugars are well-controlled with recent readings of 109 and 102 mg/dL. Awaiting updated A1c to assess long-term glucose control. Reports financial burden due to medication costs. - Order updated A1c - Continue Mounjaro  5 mg weekly - Order urine albumin to assess kidney function

## 2023-07-17 NOTE — Assessment & Plan Note (Addendum)
 Chronic hypertension, well-controlled with current medication regimen. Blood pressure is 131/74 mmHg. - Continue losartan  50 mg daily - Continue amlodipine  10 mg daily - BMP ordered today

## 2023-07-17 NOTE — Assessment & Plan Note (Signed)
 Chronic hypercholesterolemia, managed with simvastatin . Awaiting updated lipid panel to assess current cholesterol levels. - Order updated lipid panel - Continue simvastatin  20 mg daily

## 2023-07-17 NOTE — Assessment & Plan Note (Signed)
 Adenomatous polyp, benign and negative for dysplasia and malignancy. Surveillance colonoscopy recommended every five years. - Schedule colonoscopy in 2030

## 2023-07-29 ENCOUNTER — Other Ambulatory Visit: Payer: Self-pay | Admitting: Family Medicine

## 2023-07-29 DIAGNOSIS — E1169 Type 2 diabetes mellitus with other specified complication: Secondary | ICD-10-CM

## 2023-08-03 ENCOUNTER — Other Ambulatory Visit: Payer: Self-pay | Admitting: Family Medicine

## 2023-08-03 DIAGNOSIS — E1169 Type 2 diabetes mellitus with other specified complication: Secondary | ICD-10-CM

## 2023-08-03 NOTE — Telephone Encounter (Signed)
 Copied from CRM [id number]. Topic: Clinical - Medication Refill >> Aug 03, 2023  9:00 AM Carlatta H wrote: Medication: tirzepatide  (MOUNJARO ) 5 MG/0.5ML Pen [[id number]]  Has the patient contacted their pharmacy? No (Agent: If no, request that the patient contact the pharmacy for the refill. If patient does not wish to contact the pharmacy document the reason why and proceed with request.) (Agent: If yes, when and what did the pharmacy advise?)  This is the patient's preferred pharmacy:  [facility] - [place], [place] - [address] [place] [address] Phone: [phone] Fax: [phone]  Is this the correct pharmacy for this prescription? Yes If no, delete pharmacy and type the correct one.   Has the prescription been filled recently? No  Is the patient out of the medication? Yes  Has the patient been seen for an appointment in the last year OR does the patient have an upcoming appointment? Yes  Can we respond through MyChart? Yes  Agent: Please be advised that Rx refills may take up to 3 business days. We ask that you follow-up with your pharmacy.

## 2023-08-04 NOTE — Telephone Encounter (Signed)
 Requested medications are due for refill today.  yes  Requested medications are on the active medications list.  yes  Last refill. 01/01/2023 6mL 2 rf  Future visit scheduled.   yes  Notes to clinic.  Medication not assigned to a protocol. Please review for refill.    Requested Prescriptions  Pending Prescriptions Disp Refills   tirzepatide  (MOUNJARO ) 5 MG/0.5ML Pen 6 mL 2    Sig: Inject 5 mg into the skin once a week.     Off-Protocol Failed - 08/04/2023  5:33 PM      Failed - Medication not assigned to a protocol, review manually.      Passed - Valid encounter within last 12 months    Recent Outpatient Visits           2 weeks ago Type 2 diabetes mellitus with hyperlipidemia [facility])   [facility] Mimi Alt, MD

## 2023-08-06 ENCOUNTER — Other Ambulatory Visit: Payer: Self-pay | Admitting: Family Medicine

## 2023-08-06 DIAGNOSIS — E1169 Type 2 diabetes mellitus with other specified complication: Secondary | ICD-10-CM

## 2023-11-07 IMAGING — MG MM BREAST LOCALIZATION CLIP
6 series · 6 of 18 positions shown · non-contrast
Comparison: Previous exam(s).

CLINICAL DATA: Status post stereotactic guided biopsy

EXAM:
3D DIAGNOSTIC RIGHT MAMMOGRAM POST STEREOTACTIC BIOPSY

[R MLO synth-2D]
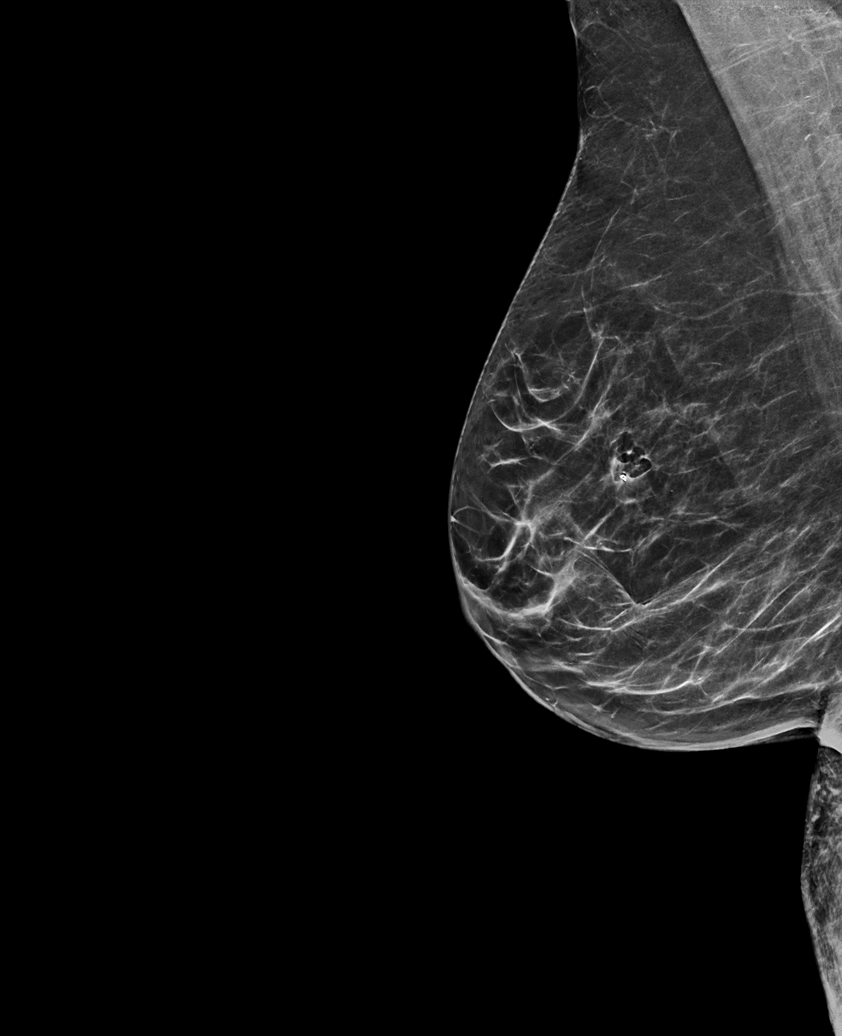

[R CC synth-2D]
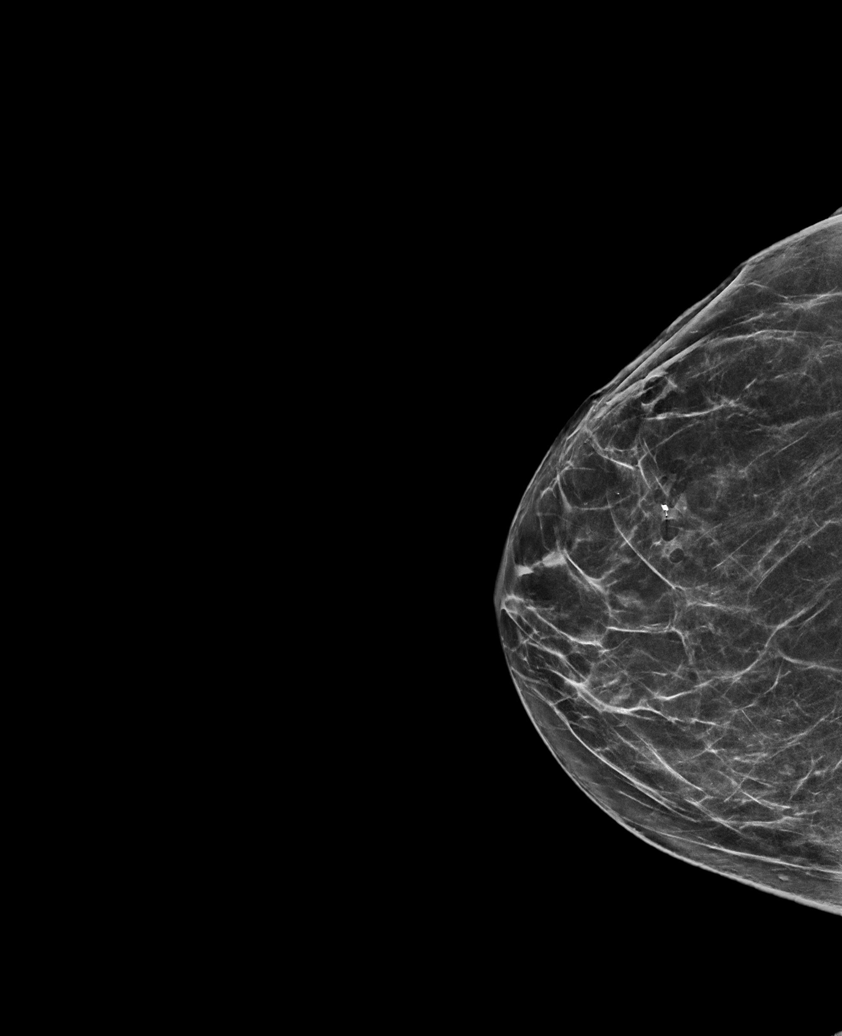

[R LM synth-2D]
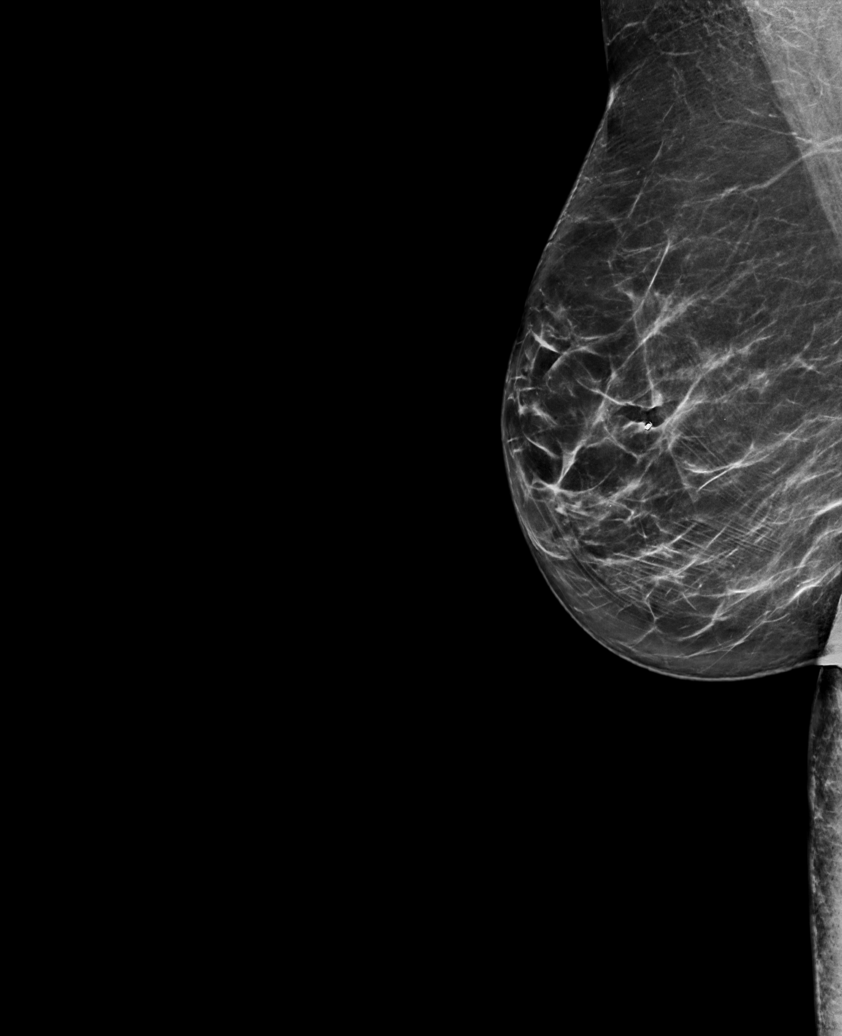

[R CC tomo · tomo slice 34/67.0]
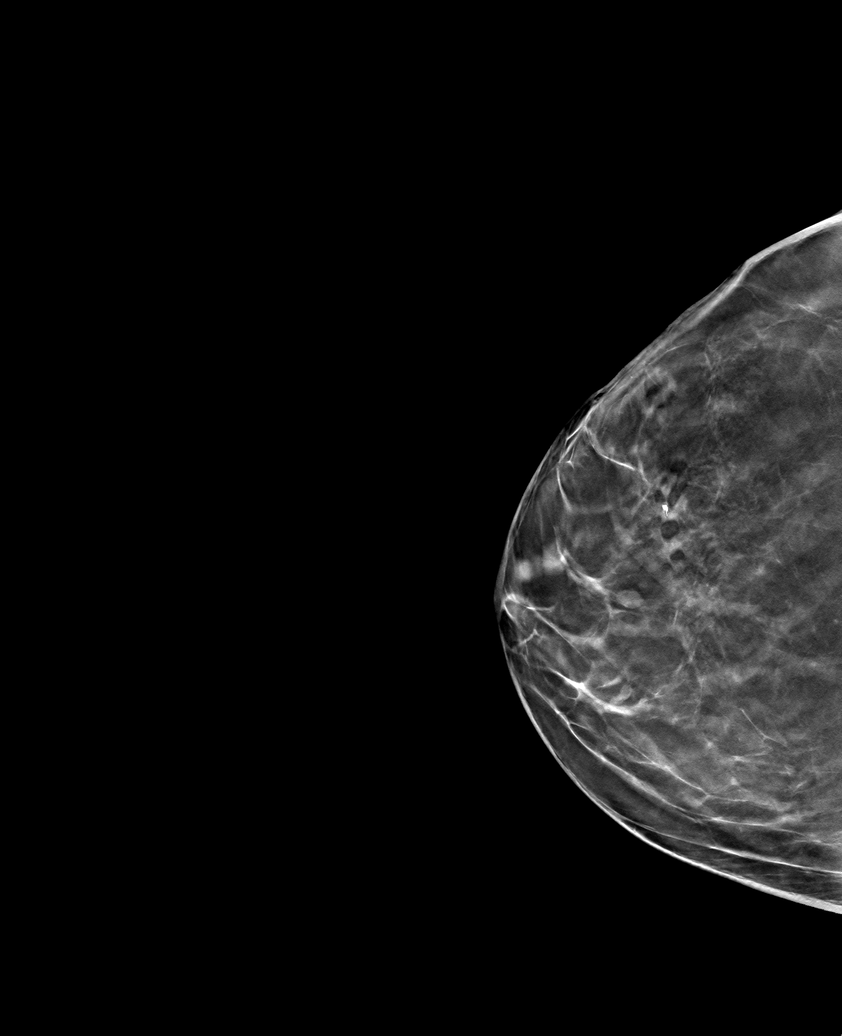

[R MLO tomo · tomo slice 33/64.0]
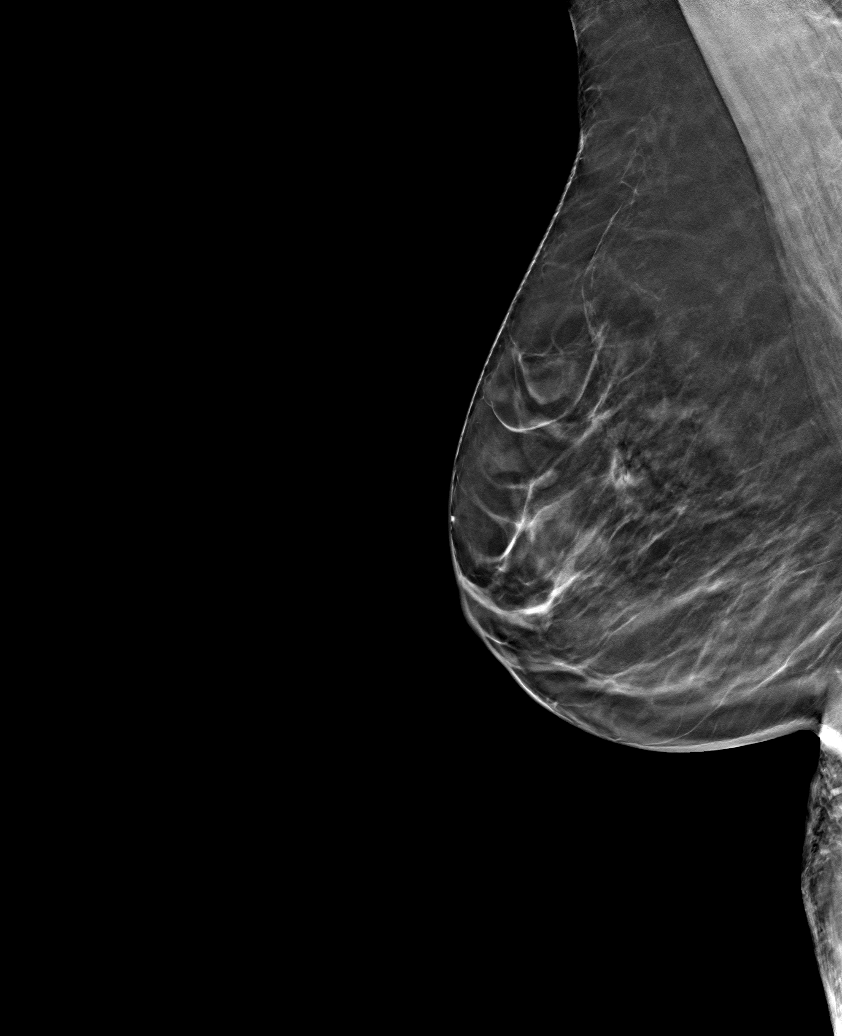

[R LM tomo · tomo slice 37/73.0]
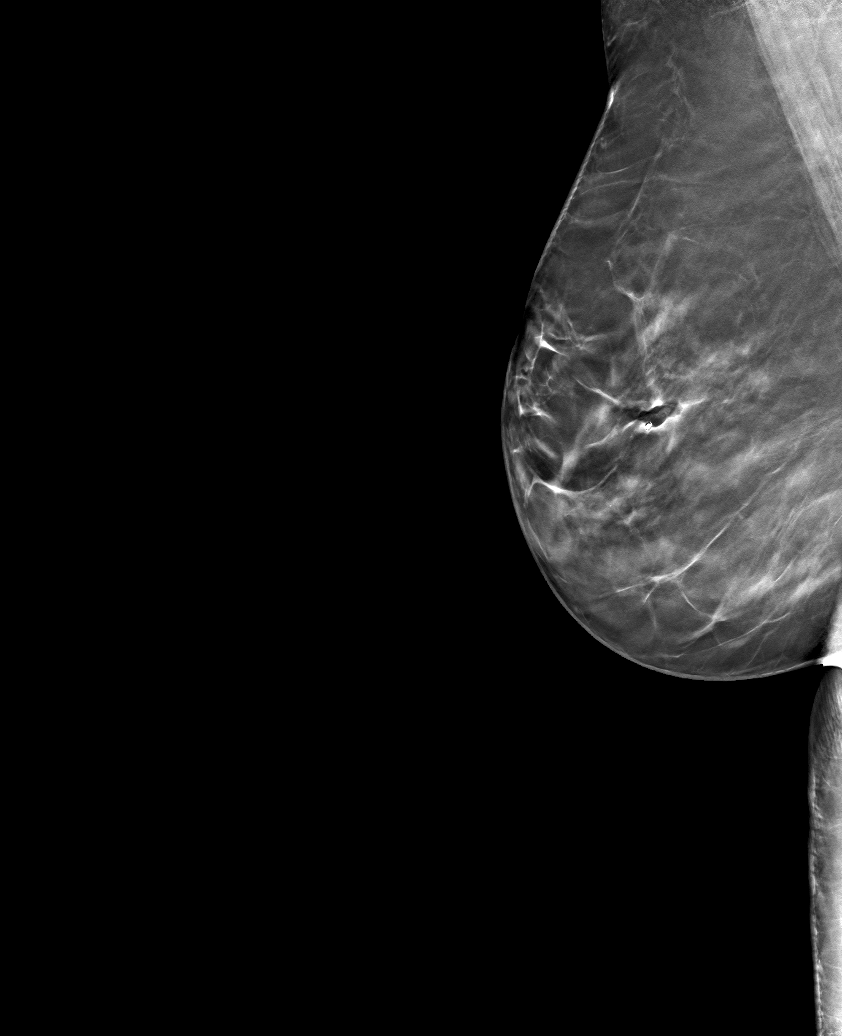

[6 of 18 positions shown; findings below may reference images not displayed]

FINDINGS: 3D Mammographic images were obtained following stereotactic guided
biopsy of a RIGHT breast mass. The COIL shaped biopsy marking clip
is in expected position at the site of biopsy.
IMPRESSION: Appropriate positioning of the COIL shaped biopsy marking clip at
the site of biopsy in the RIGHT upper outer breast.

Final Assessment: Post Procedure Mammograms for Marker Placement

## 2023-11-28 ENCOUNTER — Other Ambulatory Visit: Payer: Self-pay | Admitting: Family Medicine

## 2023-11-28 DIAGNOSIS — I10 Essential (primary) hypertension: Secondary | ICD-10-CM

## 2023-11-28 DIAGNOSIS — K219 Gastro-esophageal reflux disease without esophagitis: Secondary | ICD-10-CM

## 2023-12-02 ENCOUNTER — Other Ambulatory Visit: Payer: Self-pay | Admitting: Family Medicine

## 2023-12-02 DIAGNOSIS — K219 Gastro-esophageal reflux disease without esophagitis: Secondary | ICD-10-CM

## 2023-12-02 DIAGNOSIS — I10 Essential (primary) hypertension: Secondary | ICD-10-CM

## 2023-12-02 MED ORDER — LOSARTAN POTASSIUM 50 MG PO TABS: 50.0000 mg | ORAL_TABLET | Freq: Every day | ORAL | 0 refills | Status: DC

## 2023-12-02 NOTE — Telephone Encounter (Signed)
 FYI Only or Action Required?: Action required by provider: medication refill request.  Patient was last seen in primary care on 07/16/2023 by Sharma Coyer, MD.  Called Nurse Triage reporting No chief complaint on file..  Symptoms began today.  Interventions attempted: Nothing.  Symptoms are: stable.  Triage Disposition: No disposition on file.  Patient/caregiver understands and will follow disposition?:

## 2023-12-02 NOTE — Telephone Encounter (Signed)
 Copied from CRM [id number]. Topic: Clinical - Medication Refill >> Dec 02, 2023  2:46 PM Fonda T wrote: Medication: Losartan  (COZAAR ) 50 MG tablet  90 day supply needed with refills  Has the patient contacted their pharmacy? Yes, advised to contact office due to no refills left   This is the patient's preferred pharmacy:  [facility] - [place], [place] - [address] [place] [address] Phone: [phone] Fax: [phone]  Is this the correct pharmacy for this prescription? Yes If no, delete pharmacy and type the correct one.   Has the prescription been filled recently? Yes  Is the patient out of the medication? No  Has the patient been seen for an appointment in the last year OR does the patient have an upcoming appointment? Yes  Can we respond through MyChart? No, prefers phone call at [phone]   Agent: Please be advised that Rx refills may take up to 3 business days. We ask that you follow-up with your pharmacy.

## 2023-12-21 ENCOUNTER — Other Ambulatory Visit: Payer: Self-pay | Admitting: Family Medicine

## 2023-12-21 DIAGNOSIS — Z1231 Encounter for screening mammogram for malignant neoplasm of breast: Secondary | ICD-10-CM

## 2024-01-04 DIAGNOSIS — S46001A Unspecified injury of muscle(s) and tendon(s) of the rotator cuff of right shoulder, initial encounter: Secondary | ICD-10-CM | POA: Diagnosis not present

## 2024-01-06 DIAGNOSIS — W010XXA Fall on same level from slipping, tripping and stumbling without subsequent striking against object, initial encounter: Secondary | ICD-10-CM | POA: Diagnosis not present

## 2024-01-06 DIAGNOSIS — S46001A Unspecified injury of muscle(s) and tendon(s) of the rotator cuff of right shoulder, initial encounter: Secondary | ICD-10-CM | POA: Diagnosis not present

## 2024-01-06 DIAGNOSIS — R29898 Other symptoms and signs involving the musculoskeletal system: Secondary | ICD-10-CM | POA: Diagnosis not present

## 2024-01-07 ENCOUNTER — Ambulatory Visit
Admission: RE | Admit: 2024-01-07 | Discharge: 2024-01-07 | Disposition: A | Discharge status: Home / Self Care | Source: Ambulatory Visit | Attending: Orthopedic Surgery | Admitting: Orthopedic Surgery

## 2024-01-07 ENCOUNTER — Other Ambulatory Visit: Payer: Self-pay | Admitting: Orthopedic Surgery

## 2024-01-07 DIAGNOSIS — R29898 Other symptoms and signs involving the musculoskeletal system: Secondary | ICD-10-CM | POA: Insufficient documentation

## 2024-01-07 DIAGNOSIS — M67911 Unspecified disorder of synovium and tendon, right shoulder: Secondary | ICD-10-CM | POA: Diagnosis not present

## 2024-01-07 DIAGNOSIS — M65911 Unspecified synovitis and tenosynovitis, right shoulder: Secondary | ICD-10-CM | POA: Insufficient documentation

## 2024-01-07 DIAGNOSIS — S46011A Strain of muscle(s) and tendon(s) of the rotator cuff of right shoulder, initial encounter: Secondary | ICD-10-CM | POA: Diagnosis not present

## 2024-01-07 DIAGNOSIS — M19011 Primary osteoarthritis, right shoulder: Secondary | ICD-10-CM | POA: Insufficient documentation

## 2024-01-07 DIAGNOSIS — M25411 Effusion, right shoulder: Secondary | ICD-10-CM | POA: Insufficient documentation

## 2024-01-07 DIAGNOSIS — W19XXXA Unspecified fall, initial encounter: Secondary | ICD-10-CM

## 2024-01-07 DIAGNOSIS — S46001A Unspecified injury of muscle(s) and tendon(s) of the rotator cuff of right shoulder, initial encounter: Secondary | ICD-10-CM

## 2024-01-07 DIAGNOSIS — M7581 Other shoulder lesions, right shoulder: Secondary | ICD-10-CM | POA: Diagnosis not present

## 2024-01-07 DIAGNOSIS — M75121 Complete rotator cuff tear or rupture of right shoulder, not specified as traumatic: Secondary | ICD-10-CM | POA: Diagnosis not present

## 2024-01-13 DIAGNOSIS — M7581 Other shoulder lesions, right shoulder: Secondary | ICD-10-CM | POA: Diagnosis not present

## 2024-01-13 DIAGNOSIS — S46011A Strain of muscle(s) and tendon(s) of the rotator cuff of right shoulder, initial encounter: Secondary | ICD-10-CM | POA: Diagnosis not present

## 2024-01-14 ENCOUNTER — Other Ambulatory Visit: Payer: Self-pay | Admitting: Family Medicine

## 2024-01-14 DIAGNOSIS — H26102 Unspecified traumatic cataract, left eye: Secondary | ICD-10-CM | POA: Diagnosis not present

## 2024-01-14 DIAGNOSIS — K219 Gastro-esophageal reflux disease without esophagitis: Secondary | ICD-10-CM

## 2024-01-14 DIAGNOSIS — Z0100 Encounter for examination of eyes and vision without abnormal findings: Secondary | ICD-10-CM | POA: Diagnosis not present

## 2024-01-14 DIAGNOSIS — H21532 Iridodialysis, left eye: Secondary | ICD-10-CM | POA: Diagnosis not present

## 2024-01-14 DIAGNOSIS — E113293 Type 2 diabetes mellitus with mild nonproliferative diabetic retinopathy without macular edema, bilateral: Secondary | ICD-10-CM | POA: Diagnosis not present

## 2024-01-14 DIAGNOSIS — H524 Presbyopia: Secondary | ICD-10-CM | POA: Diagnosis not present

## 2024-01-14 DIAGNOSIS — I10 Essential (primary) hypertension: Secondary | ICD-10-CM

## 2024-01-14 DIAGNOSIS — E7800 Pure hypercholesterolemia, unspecified: Secondary | ICD-10-CM

## 2024-01-14 DIAGNOSIS — H52211 Irregular astigmatism, right eye: Secondary | ICD-10-CM | POA: Diagnosis not present

## 2024-01-14 LAB — OPHTHALMOLOGY REPORT-SCANNED

## 2024-01-18 ENCOUNTER — Encounter: Payer: Self-pay | Admitting: Family Medicine

## 2024-01-19 ENCOUNTER — Ambulatory Visit (INDEPENDENT_AMBULATORY_CARE_PROVIDER_SITE_OTHER): Payer: Medicare HMO

## 2024-01-19 DIAGNOSIS — Z780 Asymptomatic menopausal state: Secondary | ICD-10-CM | POA: Diagnosis not present

## 2024-01-19 DIAGNOSIS — Z0000 Encounter for general adult medical examination without abnormal findings: Secondary | ICD-10-CM

## 2024-01-19 NOTE — Patient Instructions (Addendum)
 Laurie Watson,  Thank you for taking the time for your Medicare Wellness Visit. I appreciate your continued commitment to your health goals. Please review the care plan we discussed, and feel free to reach out if I can assist you further.  Medicare recommends these wellness visits once per year to help you and your care team stay ahead of potential health issues. These visits are designed to focus on prevention, allowing your provider to concentrate on managing your acute and chronic conditions during your regular appointments.  Please note that Annual Wellness Visits do not include a physical exam. Some assessments may be limited, especially if the visit was conducted virtually. If needed, we may recommend a separate in-person follow-up with your provider.  Ongoing Care Seeing your primary care provider every 3 to 6 months helps us  monitor your health and provide consistent, personalized care.   Referrals If a referral was made during today's visit and you haven't received any updates within two weeks, please contact the referred provider directly to check on the status.  Recommended Screenings:  Health Maintenance  Topic Date Due   DEXA scan (bone density measurement)  12/08/2022   Complete foot exam   01/10/2023   Flu Shot  10/23/2023   COVID-19 Vaccine (4 - 2025-26 season) 11/23/2023   Hemoglobin A1C  01/15/2024   Yearly kidney function blood test for diabetes  07/15/2024   Yearly kidney health urinalysis for diabetes  07/15/2024   Eye exam for diabetics  01/13/2025   Breast Cancer Screening  01/14/2025   Medicare Annual Wellness Visit  01/18/2025   Colon Cancer Screening  06/28/2028   DTaP/Tdap/Td vaccine (4 - Td or Tdap) 01/11/2033   Pneumococcal Vaccine for age over 27  Completed   Hepatitis C Screening  Completed   Zoster (Shingles) Vaccine  Completed   Meningitis B Vaccine  Aged Out       01/19/2024    2:45 PM  Advanced Directives  Does Patient Have a Medical Advance  Directive? Yes  Type of Estate Agent of [place];Living will  Does patient want to make changes to medical advance directive? No - Patient declined  Copy of Healthcare Power of Attorney in Chart? Yes - validated most recent copy scanned in chart (See row information)   Advance Care Planning is important because it: Ensures you receive medical care that aligns with your values, goals, and preferences. Provides guidance to your family and loved ones, reducing the emotional burden of decision-making during critical moments.  Vision: Annual vision screenings are recommended for early detection of glaucoma, cataracts, and diabetic retinopathy. These exams can also reveal signs of chronic conditions such as diabetes and high blood pressure.  Dental: Annual dental screenings help detect early signs of oral cancer, gum disease, and other conditions linked to overall health, including heart disease and diabetes.  Please see the attached documents for additional preventive care recommendations.   NEXT AWV 01/24/25 BY VIDEO

## 2024-01-19 NOTE — Progress Notes (Signed)
 Subjective:   Laurie Watson is a 73 y.o. who presents for a Medicare Wellness preventive visit.  As a reminder, Annual Wellness Visits don't include a physical exam, and some assessments may be limited, especially if this visit is performed virtually. We may recommend an in-person follow-up visit with your provider if needed.  Visit Complete: Virtual I connected with  Laurie Watson on 01/19/24 by a audio enabled telemedicine application and verified that I am speaking with the correct person using two identifiers.  Patient Location: Home  Provider Location: Office/Clinic  I discussed the limitations of evaluation and management by telemedicine. The patient expressed understanding and agreed to proceed.  Vital Signs: Because this visit was a virtual/telehealth visit, some criteria may be missing or patient reported. Any vitals not documented were not able to be obtained and vitals that have been documented are patient reported.  VideoDeclined- This patient declined Librarian, academic. Therefore the visit was completed with audio only.  Persons Participating in Visit: Patient.  AWV Questionnaire: No: Patient Medicare AWV questionnaire was not completed prior to this visit.  Cardiac Risk Factors include: advanced age (>33men, >67 women);diabetes mellitus;dyslipidemia;hypertension     Objective:    Today's Vitals   01/19/24 1437  PainSc: 4    There is no height or weight on file to calculate BMI.     01/19/2024    2:45 PM 06/29/2023    7:58 AM 01/13/2023    3:28 PM 03/12/2018    6:47 AM 01/18/2018    7:42 AM 12/12/2014   12:11 PM 09/05/2014    9:06 AM  Advanced Directives  Does Patient Have a Medical Advance Directive? Yes Yes Yes Yes  Yes  Yes  Yes   Type of Estate Agent of [place];Living will Healthcare Power of [place];Living will Healthcare Power of [place];Living will Healthcare Power of [place];Living will Living  will Healthcare Power of [place];Living will  Healthcare Power of [place];Living will   Does patient want to make changes to medical advance directive? No - Patient declined        Copy of Healthcare Power of Attorney in Chart? Yes - validated most recent copy scanned in chart (See row information)           Data saved with a previous flowsheet row definition    Current Medications (verified) Outpatient Encounter Medications as of 01/19/2024  Medication Sig   amLODipine  (NORVASC ) 10 MG tablet TAKE 1 TABLET EVERY DAY   aspirin 81 MG chewable tablet Chew 1 tablet by mouth daily.   cholecalciferol (VITAMIN D ) 1000 UNITS tablet Take 1 tablet by mouth daily.   Coenzyme Q10 (COQ10 PO) Take by mouth.   colchicine  0.6 MG tablet Take 2 tabs (1.2mg ) by mouth at first sign of flare followed by 0.6mg  after 1 hour and then 1-2 tabs daily as needed until symptom resolution   COLLAGEN-VITAMIN C-BIOTIN PO Take by mouth. 6g per serving   docusate sodium (COLACE) 100 MG capsule Take 1 capsule by mouth daily.   gabapentin  (NEURONTIN ) 100 MG capsule Take 1 capsule (100 mg total) by mouth at bedtime.   glucose blood test strip To check blood sugar daily   losartan  (COZAAR ) 50 MG tablet Take 1 tablet (50 mg total) by mouth daily.   Microlet Lancets MISC Use once daily to check blood sugar   Omega-3 Fatty Acids (FISH OIL BURP-LESS) 1000 MG CAPS Take 1 capsule by mouth daily.   omeprazole  (PRILOSEC) 20 MG  capsule TAKE 1 CAPSULE EVERY DAY   Probiotic CAPS Take 1 capsule by mouth daily.   simvastatin  (ZOCOR ) 20 MG tablet TAKE 1 TABLET EVERY DAY   tirzepatide  (MOUNJARO ) 5 MG/0.5ML Pen INJECT 5 MG INTO THE SKIN ONCE A WEEK.   No facility-administered encounter medications on file as of 01/19/2024.    Allergies (verified) Codeine, Pholcodine, and Penicillins   History: Past Medical History:  Diagnosis Date   Allergy    Diabetes mellitus without complication (HCC)    Family history of adverse reaction to  anesthesia    Daughter admitted to hosp after surgery(age 23). breathing difficulties, cholinesterase deficiency.   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)   Past Surgical History:  Procedure Laterality Date   ACHILLES TENDON SURGERY Right 03/12/2018   Procedure: ACHILLES TENDON REPAIR-SECONDARY;  Surgeon: Lilli Cough, DPM;  Location: [facility];  Service: Podiatry;  Laterality: Right;  tendon anchors lma with popliteal block   ANAL FISSURE REPAIR  11/13/2013   BREAST BIOPSY Right 1990's   Core - neg   BREAST BIOPSY Right 01/29/2021   Stereo Bx, Coil Clip, path pending   CALCANEAL OSTEOTOMY Right 03/12/2018   Procedure: PARTIAL CALCANECTOMY;  Surgeon: Lilli Cough, DPM;  Location: [facility];  Service: Podiatry;  Laterality: Right;  Prediabetic   COLONOSCOPY WITH ESOPHAGOGASTRODUODENOSCOPY (EGD)  05/05/2011   COLONOSCOPY WITH PROPOFOL  N/A 01/18/2018   Procedure: COLONOSCOPY WITH PROPOFOL ;  Surgeon: Viktoria Lamar DASEN, MD;  Location: [facility] END[facility];  Service: Endoscopy;  Laterality: N/A;   COLONOSCOPY WITH PROPOFOL  N/A 06/29/2023   Procedure: COLONOSCOPY WITH PROPOFOL ;  Surgeon: Onita Elspeth Sharper, DO;  Location: [facility] END[facility]COPY;  Service: Gastroenterology;  Laterality: N/A;  DM   ELBOW SURGERY Left    EYE SURGERY Left 1972   KNEE SURGERY Left    POLYPECTOMY  06/29/2023   Procedure: POLYPECTOMY, INTESTINE;  Surgeon: Onita Elspeth Sharper, DO;  Location: [facility] ENDOSCOPY;  Service: Gastroenterology;;   TONSILLECTOMY AND ADENOIDECTOMY  1970   TUBAL LIGATION     Family History  Problem Relation Age of Onset   Diabetes Mother    Bladder Cancer Mother    Breast cancer Mother 53   Cancer Mother    Hypertension Father    Diabetes Father    CVA Father    Cancer Father    Diabetes Brother     Hyperlipidemia Brother    Heart attack Brother    Diabetes Brother    Diabetes Brother    Breast cancer Daughter 22       triple neg   Social History   Socioeconomic History   Marital status: Married    Spouse name: Not on file   Number of children: Not on file   Years of education: Not on file   Highest education level: Bachelor's degree (e.g., BA, AB, BS)  Occupational History   Not on file  Tobacco Use   Smoking status: Never   Smokeless tobacco: Never  Vaping Use   Vaping status: Never Used  Substance and Sexual Activity   Alcohol use: Yes    Comment: Not often   Drug use: No   Sexual activity: Yes    Birth control/protection: None  Other Topics Concern   Not on file  Social History Narrative   Not on file  Social Drivers of Corporate Investment Banker Strain: Low Risk  (01/19/2024)   Overall Financial Resource Strain (CARDIA)    Difficulty of Paying Living Expenses: Not hard at all  Food Insecurity: No Food Insecurity (01/19/2024)   Hunger Vital Sign    Worried About Running Out of Food in the Last Year: Never true    Ran Out of Food in the Last Year: Never true  Transportation Needs: No Transportation Needs (01/19/2024)   PRAPARE - Administrator, Civil Service (Medical): No    Lack of Transportation (Non-Medical): No  Physical Activity: Insufficiently Active (01/19/2024)   Exercise Vital Sign    Days of Exercise per Week: 3 days    Minutes of Exercise per Session: 30 min  Stress: No Stress Concern Present (01/19/2024)   [employer] of Occupational Health - Occupational Stress Questionnaire    Feeling of Stress: Not at all  Social Connections: Socially Integrated (01/19/2024)   Social Connection and Isolation Panel    Frequency of Communication with Friends and Family: Twice a week    Frequency of Social Gatherings with Friends and Family: Twice a week    Attends Religious Services: More than 4 times per year    Active Member of [employer]  or Organizations: Yes    Attends Engineer, Structural: More than 4 times per year    Marital Status: Married    Tobacco Counseling Counseling given: Not Answered    Clinical Intake:  Pre-visit preparation completed: Yes  Pain : 0-10 Pain Score: 4  Pain Type: Acute pain Pain Location: Shoulder Pain Orientation: Right Pain Descriptors / Indicators: Aching, Constant Pain Onset: 1 to 4 weeks ago Pain Frequency: Constant Pain Relieving Factors: getting ready to have surgery  Pain Relieving Factors: getting ready to have surgery  BMI - recorded: 25.9 Nutritional Status: BMI 25 -29 Overweight Nutritional Risks: None Diabetes: Yes CBG done?: No Did pt. bring in CBG monitor from home?: No  Lab Results  Component Value Date   HGBA1C 5.9 (H) 07/16/2023   HGBA1C 5.7 (H) 12/31/2022   HGBA1C 5.6 06/30/2022     How often do you need to have someone help you when you read instructions, pamphlets, or other written materials from your doctor or pharmacy?: 1 - Never  Interpreter Needed?: No  Information entered by :: JHONNIE DAS, LPN   Activities of Daily Living     01/19/2024    2:47 PM 01/18/2024    3:17 PM  In your present state of health, do you have any difficulty performing the following activities:  Hearing? 0 0  Vision? 0 0  Difficulty concentrating or making decisions? 0 0  Walking or climbing stairs? 0 0  Dressing or bathing? 0 0  Doing errands, shopping? 0 0  Preparing Food and eating ? N N  Using the Toilet? N N  In the past six months, have you accidently leaked urine? N N  Do you have problems with loss of bowel control? N N  Managing your Medications? N N  Managing your Finances? N N  Housekeeping or managing your Housekeeping? N N    Patient Care Team: Sharma Coyer, MD as PCP - General (Family Medicine) Portia Fireman, OD (Optometry)  I have updated your Care Teams any recent Medical Services you may have received from  other providers in the past year.     Assessment:   This is a routine wellness examination for Laurie Watson.  Hearing/Vision screen Hearing Screening -  Comments:: NO AIDS Vision Screening - Comments:: WEARS GLASSES- DR.BULAKOWSKI- SEEN EVERY YEAR- NO DIABETIC RETINOPATHY- LEGALLY BLIND IN LEFT EYE   Goals Addressed             This Visit's Progress    DIET - EAT MORE FRUITS AND VEGETABLES         Depression Screen     01/19/2024    2:44 PM 12/31/2022    9:30 AM 06/30/2022   10:44 AM 01/09/2022   10:10 AM 07/02/2021    9:47 AM 01/01/2021    9:38 AM 12/15/2018    9:56 AM  PHQ 2/9 Scores  PHQ - 2 Score 0 0 0 0 0 1 0  PHQ- 9 Score 0    0 1     Fall Risk     01/18/2024    3:17 PM 01/15/2024    8:55 AM 01/12/2023    5:22 PM 06/30/2022   10:44 AM 01/09/2022   10:00 AM  Fall Risk   Falls in the past year? 1 1 0 0 0  Number falls in past yr: 0 0 0 0 0  Injury with Fall? 1 1 0 0 0  Risk for fall due to :   No Fall Risks No Fall Risks No Fall Risks  Follow up Falls evaluation completed;Falls prevention discussed  Education provided;Falls prevention discussed      MEDICARE RISK AT HOME:  Medicare Risk at Home Any stairs in or around the home?: Yes If so, are there any without handrails?: No Home free of loose throw rugs in walkways, pet beds, electrical cords, etc?: Yes Adequate lighting in your home to reduce risk of falls?: Yes Life alert?: No Use of a cane, walker or w/c?: No Grab bars in the bathroom?: Yes Shower chair or bench in shower?: Yes Elevated toilet seat or a handicapped toilet?: No  TIMED UP AND GO:  Was the test performed?  No  Cognitive Function: 6CIT completed        01/19/2024    2:48 PM 01/13/2023    3:29 PM  6CIT Screen  What Year? 0 points 0 points  What month? 0 points 0 points  What time? 0 points 0 points  Count back from 20 0 points 0 points  Months in reverse 0 points 0 points  Repeat phrase 0 points 0 points  Total Score 0 points 0  points    Immunizations Immunization History  Administered Date(s) Administered   Fluad Quad(high Dose 65+) 12/15/2018, 12/26/2019, 01/01/2021, 01/09/2022   Fluad Trivalent(High Dose 65+) 12/31/2022   Influenza,inj,Quad PF,6+ Mos 03/05/2015, 03/05/2016   Influenza-Unspecified 11/11/2017   PFIZER(Purple Top)SARS-COV-2 Vaccination 06/29/2019, 07/20/2019, 03/26/2020   Pneumococcal Conjugate-13 12/15/2018   Pneumococcal Polysaccharide-23 02/11/2012, 11/11/2017   Td 04/23/1999   Tdap 10/17/2010, 01/12/2023   Zoster Recombinant(Shingrix) 01/10/2019, 03/21/2019   Zoster, Live 04/05/2010    Screening Tests Health Maintenance  Topic Date Due   DEXA SCAN  12/08/2022   FOOT EXAM  01/10/2023   Influenza Vaccine  10/23/2023   COVID-19 Vaccine (4 - 2025-26 season) 11/23/2023   HEMOGLOBIN A1C  01/15/2024   Diabetic kidney evaluation - eGFR measurement  07/15/2024   Diabetic kidney evaluation - Urine ACR  07/15/2024   OPHTHALMOLOGY EXAM  01/13/2025   Mammogram  01/14/2025   Medicare Annual Wellness (AWV)  01/18/2025   Colonoscopy  06/28/2028   DTaP/Tdap/Td (4 - Td or Tdap) 01/11/2033   Pneumococcal Vaccine: 50+ Years  Completed   Hepatitis C Screening  Completed   Zoster Vaccines- Shingrix  Completed   Meningococcal B Vaccine  Aged Out    Health Maintenance Items Addressed: NEEDS FLU SHOT; UTD ON COLONOSCOPY;APPT COMING FOR MAMMOGRAM; [facility] REFERRAL SENT  Additional Screening:  Vision Screening: Recommended annual ophthalmology exams for early detection of glaucoma and other disorders of the eye. Is the patient up to date with their annual eye exam?  Yes  Who is the provider or what is the name of the office in which the patient attends annual eye exams? DR.BULAKOWSKI  Dental Screening: Recommended annual dental exams for proper oral hygiene  Community Resource Referral / Chronic Care Management: CRR required this visit?  No   CCM required this visit?  No   Plan:    I have  personally reviewed and noted the following in the patient's chart:   Medical and social history Use of alcohol, tobacco or illicit drugs  Current medications and supplements including opioid prescriptions. Patient is not currently taking opioid prescriptions. Functional ability and status Nutritional status Physical activity Advanced directives List of other physicians Hospitalizations, surgeries, and ER visits in previous 12 months Vitals Screenings to include cognitive, depression, and falls Referrals and appointments  In addition, I have reviewed and discussed with patient certain preventive protocols, quality metrics, and best practice recommendations. A written personalized care plan for preventive services as well as general preventive health recommendations were provided to patient.   Jhonnie GORMAN Das, LPN   [id number]   After Visit Summary: (MyChart) Due to this being a telephonic visit, the after visit summary with patients personalized plan was offered to patient via MyChart   Notes: [facility] REFERRAL SENT

## 2024-01-20 ENCOUNTER — Other Ambulatory Visit: Payer: Self-pay | Admitting: Surgery

## 2024-01-20 ENCOUNTER — Other Ambulatory Visit: Payer: Self-pay

## 2024-01-20 ENCOUNTER — Telehealth: Payer: Self-pay

## 2024-01-20 ENCOUNTER — Encounter
Admission: RE | Admit: 2024-01-20 | Discharge: 2024-01-20 | Disposition: A | Discharge status: Home / Self Care | Source: Ambulatory Visit | Attending: Surgery | Admitting: Surgery

## 2024-01-20 VITALS — Ht 63.0 in | Wt 150.0 lb

## 2024-01-20 DIAGNOSIS — I10 Essential (primary) hypertension: Secondary | ICD-10-CM | POA: Diagnosis not present

## 2024-01-20 DIAGNOSIS — E113293 Type 2 diabetes mellitus with mild nonproliferative diabetic retinopathy without macular edema, bilateral: Secondary | ICD-10-CM | POA: Insufficient documentation

## 2024-01-20 DIAGNOSIS — Z01818 Encounter for other preprocedural examination: Secondary | ICD-10-CM | POA: Diagnosis not present

## 2024-01-20 DIAGNOSIS — M1A4720 Other secondary chronic gout, left ankle and foot, without tophus (tophi): Secondary | ICD-10-CM

## 2024-01-20 DIAGNOSIS — E1169 Type 2 diabetes mellitus with other specified complication: Secondary | ICD-10-CM

## 2024-01-20 HISTORY — DX: Unspecified rotator cuff tear or rupture of right shoulder, not specified as traumatic: M75.101

## 2024-01-20 NOTE — Telephone Encounter (Signed)
 Please review

## 2024-01-20 NOTE — Patient Instructions (Addendum)
 Your procedure is scheduled on: [facility] 01/27/24 Report to the Registration Desk on the 1st floor of the [facility].  To find out your arrival time, please call [phone] between 1PM - 3PM on: TUESDAY 01/26/24 If your arrival time is 6:00 am, do not arrive before that time as the [facility] entrance doors do not open until 6:00 am.  REMEMBER: Instructions that are not followed completely may result in serious medical risk, up to and including death; or upon the discretion of your surgeon and anesthesiologist your surgery may need to be rescheduled.  Do not eat food after midnight the night before surgery.  No gum chewing or hard candies.  You may however, drink WATER up to 2 hours before you are scheduled to arrive for your surgery. Do not drink anything within 2 hours of your scheduled arrival time.  In addition, your doctor has ordered for you to drink the provided:  Gatorade G2 Drinking this carbohydrate drink up to two hours before surgery helps to reduce insulin resistance and improve patient outcomes. Please complete drinking 2 hours before scheduled arrival time.  One week prior to surgery:  Stop Anti-inflammatories (NSAIDS) such as Advil, Aleve, Ibuprofen, Motrin, Naproxen, Naprosyn and Aspirin based products such as Excedrin, Goody's Powder, BC Powder.  Stop ANY OVER THE COUNTER supplements until after surgery.  Stop tirzepatide  (MOUNJARO ) until after surgery. Last dose: 01/18/24  Stop aspirin until after surgery. Last dose 01/20/24  You may however, continue to take Tylenol  if needed for pain up until the day of surgery.  Continue taking all of your other prescription medications up until the day of surgery.  ON THE DAY OF SURGERY ONLY TAKE THESE MEDICATIONS WITH SIPS OF WATER:  omeprazole  (PRILOSEC)  simvastatin  (ZOCOR )  amLODipine  (NORVASC )   Use inhalers on the day of surgery and bring to the hospital.  No Alcohol for 24 hours before or after surgery.  No  Smoking including e-cigarettes for 24 hours before surgery.  No chewable tobacco products for at least 6 hours before surgery.  No nicotine patches on the day of surgery.  Do not use any recreational drugs for at least a week (preferably 2 weeks) before your surgery.  Please be advised that the combination of cocaine and anesthesia may have negative outcomes, up to and including death. If you test positive for cocaine, your surgery will be cancelled.  On the morning of surgery brush your teeth with toothpaste and water, you may rinse your mouth with mouthwash if you wish. Do not swallow any toothpaste or mouthwash.  Use CHG Soap or wipes as directed on instruction sheet.  Do not wear lotions, powders, perfumes, jewelry, make-up, hairpins, clips or nail polish.  For welded (permanent) jewelry: bracelets, anklets, waist bands, etc.  Please have this removed prior to surgery.  If it is not removed, there is a chance that hospital personnel will need to cut it off on the day of surgery.  Do not shave body hair from the neck down 48 hours before surgery.  Contact lenses, hearing aids and dentures may not be worn into surgery.  Do not bring valuables to the hospital. [facility] is not responsible for any missing/lost belongings or valuables.   Notify your doctor if there is any change in your medical condition (cold, fever, infection).   Wear comfortable clothing (specific to your surgery type) to the hospital.  After surgery, you can help prevent lung complications by doing breathing exercises.  Take deep breaths  and cough every 1-2 hours. Your doctor may order a device called an Incentive Spirometer to help you take deep breaths.  If you are being discharged the day of surgery, you will not be allowed to drive home. You will need a responsible individual to drive you home and stay with you for 24 hours after surgery.   If you are taking public transportation, you will need to have a  responsible individual with you.  Surgery Visitation Policy:  Patients having surgery or a procedure may have two visitors.  Children under the age of 73 must have an adult with them who is not the patient.  Merchandiser, Retail to address health-related social needs:  [email or web address]  Please call the Pre-admissions Testing Dept. at [phone] if you have any questions about these instructions.                                                                                               Preparing for Surgery with CHLORHEXIDINE GLUCONATE (CHG) Soap  Chlorhexidine Gluconate (CHG) Soap  o An antiseptic cleaner that kills germs and bonds with the skin to continue killing germs even after washing  o Used for showering the night before surgery and morning of surgery  Before surgery, you can play an important role by reducing the number of germs on your skin.  CHG (Chlorhexidine gluconate) soap is an antiseptic cleanser which kills germs and bonds with the skin to continue killing germs even after washing.  Please do not use if you have an allergy to CHG or antibacterial soaps. If your skin becomes reddened/irritated stop using the CHG.  1. Shower the NIGHT BEFORE SURGERY with CHG soap.   2. If you choose to wash your hair, wash your hair first as usual with your normal shampoo.  3. After shampooing, rinse your hair and body thoroughly to remove the shampoo.  4. Use CHG as you would any other liquid soap. You can apply CHG directly to the skin and wash gently with a clean washcloth.  5. Apply the CHG soap to your body only from the neck down. Do not use on open wounds or open sores. Avoid contact with your eyes, ears, mouth, and genitals (private parts). Wash face and genitals (private parts) with your normal soap.  6. Wash thoroughly, paying special attention to the area where your surgery will be performed.  7. Thoroughly rinse your body with warm  water.  8. Do not shower/wash with your normal soap after using and rinsing off the CHG soap.  9. Do not use lotions, oils, etc., after showering with CHG.  10. Pat yourself dry with a clean towel.   11. Wear clean pajamas to bed the night before surgery.  12. Place clean sheets on your bed the night of your shower and do not sleep with pets.  13. Do not apply any deodorants/lotions/powders.  14. Please wear clean clothes to the hospital.  15. Remember to brush your teeth with your regular toothpaste.

## 2024-01-20 NOTE — Telephone Encounter (Signed)
 Copied from CRM [id number]. Topic: Clinical - Request for Lab/Test Order >> Jan 20, 2024  2:18 PM Laurie Watson wrote: Reason for CRM: Patient is scheduled for surgery on 01/27/24 and also has appointment with Dr. Lang on 01/26/24. Patient needs to get pre admission labs completed, she is scheduled on Friday 01/22/24 for labs. [name] with [facility] testing inquiring if patient is going to need labs done before appt with Dr. Lang. If patient is needing labs, would like to know which labs Dr. Lang would like to have drawn and if orders could be placed before Friday.   Ms. Laurie Watson would like to avoid patient being poked twice and getting charge twice for the same labs.  Can secure chat [name] @ [facility], or call office, [phone].

## 2024-01-21 NOTE — Telephone Encounter (Signed)
 Labs discussed via Secure Chat  -requested A1c, lipids and uric acid to be collected

## 2024-01-22 ENCOUNTER — Encounter: Payer: Self-pay | Admitting: Urgent Care

## 2024-01-22 ENCOUNTER — Encounter
Admission: RE | Admit: 2024-01-22 | Discharge: 2024-01-22 | Disposition: A | Discharge status: Home / Self Care | Source: Ambulatory Visit | Attending: Surgery | Admitting: Surgery

## 2024-01-22 DIAGNOSIS — E1169 Type 2 diabetes mellitus with other specified complication: Secondary | ICD-10-CM | POA: Diagnosis not present

## 2024-01-22 DIAGNOSIS — E785 Hyperlipidemia, unspecified: Secondary | ICD-10-CM | POA: Diagnosis not present

## 2024-01-22 DIAGNOSIS — Z01810 Encounter for preprocedural cardiovascular examination: Secondary | ICD-10-CM | POA: Diagnosis not present

## 2024-01-22 DIAGNOSIS — E113293 Type 2 diabetes mellitus with mild nonproliferative diabetic retinopathy without macular edema, bilateral: Secondary | ICD-10-CM | POA: Insufficient documentation

## 2024-01-22 DIAGNOSIS — M1A4720 Other secondary chronic gout, left ankle and foot, without tophus (tophi): Secondary | ICD-10-CM | POA: Diagnosis not present

## 2024-01-22 DIAGNOSIS — Z01818 Encounter for other preprocedural examination: Secondary | ICD-10-CM | POA: Insufficient documentation

## 2024-01-22 DIAGNOSIS — I10 Essential (primary) hypertension: Secondary | ICD-10-CM | POA: Insufficient documentation

## 2024-01-22 LAB — CBC
HCT: 40.6 % (ref 36.0–46.0)
Hemoglobin: 13.3 g/dL (ref 12.0–15.0)
MCH: 30 pg (ref 26.0–34.0)
MCHC: 32.8 g/dL (ref 30.0–36.0)
MCV: 91.6 fL (ref 80.0–100.0)
Platelets: 267 K/uL (ref 150–400)
RBC: 4.43 MIL/uL (ref 3.87–5.11)
RDW: 12.7 % (ref 11.5–15.5)
WBC: 4 K/uL (ref 4.0–10.5)
nRBC: 0 % (ref 0.0–0.2)

## 2024-01-22 LAB — BASIC METABOLIC PANEL WITH GFR
Anion gap: 9 (ref 5–15)
BUN: 15 mg/dL (ref 8–23)
CO2: 29 mmol/L (ref 22–32)
Calcium: 9.2 mg/dL (ref 8.9–10.3)
Chloride: 103 mmol/L (ref 98–111)
Creatinine, Ser: 0.66 mg/dL (ref 0.44–1.00)
GFR, Estimated: >60 mL/min (ref >60)
Glucose, Bld: 122 mg/dL — ABNORMAL HIGH (ref 70–99)
Potassium: 4 mmol/L (ref 3.5–5.1)
Sodium: 141 mmol/L (ref 135–145)

## 2024-01-22 LAB — LIPID PANEL
Cholesterol: 174 mg/dL (ref 0–200)
HDL: 59 mg/dL (ref >40)
LDL Cholesterol: 91 mg/dL (ref 0–99)
Total CHOL/HDL Ratio: 2.9 ratio
Triglycerides: 118 mg/dL (ref <150)
VLDL: 24 mg/dL (ref 0–40)

## 2024-01-22 LAB — HEMOGLOBIN A1C
Hgb A1c MFr Bld: 5.8 % — ABNORMAL HIGH (ref 4.8–5.6)
Mean Plasma Glucose: 119.76 mg/dL

## 2024-01-22 LAB — URIC ACID: Uric Acid, Serum: 7.1 mg/dL (ref 2.5–7.1)

## 2024-01-25 ENCOUNTER — Ambulatory Visit: Payer: Self-pay | Admitting: Family Medicine

## 2024-01-26 ENCOUNTER — Ambulatory Visit
Admission: RE | Admit: 2024-01-26 | Discharge: 2024-01-26 | Disposition: A | Discharge status: Home / Self Care | Source: Ambulatory Visit | Attending: Family Medicine | Admitting: Family Medicine

## 2024-01-26 ENCOUNTER — Ambulatory Visit: Payer: Self-pay | Admitting: Family Medicine

## 2024-01-26 ENCOUNTER — Encounter: Payer: Self-pay | Admitting: Family Medicine

## 2024-01-26 ENCOUNTER — Ambulatory Visit
Admission: RE | Admit: 2024-01-26 | Discharge: 2024-01-26 | Disposition: A | Source: Ambulatory Visit | Attending: Family Medicine

## 2024-01-26 ENCOUNTER — Ambulatory Visit: Admitting: Family Medicine

## 2024-01-26 VITALS — BP 132/80 | HR 69 | Ht 63.0 in | Wt 152.0 lb

## 2024-01-26 DIAGNOSIS — Z7985 Long-term (current) use of injectable non-insulin antidiabetic drugs: Secondary | ICD-10-CM

## 2024-01-26 DIAGNOSIS — Z780 Asymptomatic menopausal state: Secondary | ICD-10-CM | POA: Insufficient documentation

## 2024-01-26 DIAGNOSIS — K5903 Drug induced constipation: Secondary | ICD-10-CM | POA: Diagnosis not present

## 2024-01-26 DIAGNOSIS — K219 Gastro-esophageal reflux disease without esophagitis: Secondary | ICD-10-CM

## 2024-01-26 DIAGNOSIS — I152 Hypertension secondary to endocrine disorders: Secondary | ICD-10-CM

## 2024-01-26 DIAGNOSIS — E559 Vitamin D deficiency, unspecified: Secondary | ICD-10-CM

## 2024-01-26 DIAGNOSIS — Z1231 Encounter for screening mammogram for malignant neoplasm of breast: Secondary | ICD-10-CM | POA: Insufficient documentation

## 2024-01-26 DIAGNOSIS — E785 Hyperlipidemia, unspecified: Secondary | ICD-10-CM

## 2024-01-26 DIAGNOSIS — M85851 Other specified disorders of bone density and structure, right thigh: Secondary | ICD-10-CM | POA: Diagnosis not present

## 2024-01-26 DIAGNOSIS — E11319 Type 2 diabetes mellitus with unspecified diabetic retinopathy without macular edema: Secondary | ICD-10-CM | POA: Diagnosis not present

## 2024-01-26 DIAGNOSIS — M1A4720 Other secondary chronic gout, left ankle and foot, without tophus (tophi): Secondary | ICD-10-CM | POA: Diagnosis not present

## 2024-01-26 DIAGNOSIS — E1169 Type 2 diabetes mellitus with other specified complication: Secondary | ICD-10-CM

## 2024-01-26 DIAGNOSIS — E113293 Type 2 diabetes mellitus with mild nonproliferative diabetic retinopathy without macular edema, bilateral: Secondary | ICD-10-CM | POA: Diagnosis not present

## 2024-01-26 DIAGNOSIS — E1159 Type 2 diabetes mellitus with other circulatory complications: Secondary | ICD-10-CM | POA: Diagnosis not present

## 2024-01-26 MED ORDER — CHLORHEXIDINE GLUCONATE 0.12 % MT SOLN
15.0000 mL | Freq: Once | OROMUCOSAL | Status: AC
  Administered 2024-01-27: 15 mL via OROMUCOSAL

## 2024-01-26 MED ORDER — CEFAZOLIN SODIUM-DEXTROSE 2-4 GM/100ML-% IV SOLN
2.0000 g | INTRAVENOUS | Status: AC
  Administered 2024-01-27: 2 g via INTRAVENOUS

## 2024-01-26 MED ORDER — MOUNJARO 5 MG/0.5ML ~~LOC~~ SOAJ: 5.0000 mg | SUBCUTANEOUS | 3 refills | Status: AC

## 2024-01-26 MED ORDER — SODIUM CHLORIDE 0.9 % IV SOLN: INTRAVENOUS | Status: DC

## 2024-01-26 MED ORDER — ORAL CARE MOUTH RINSE: 15.0000 mL | Freq: Once | OROMUCOSAL | Status: AC

## 2024-01-26 NOTE — Assessment & Plan Note (Signed)
 Chronic condition  Blood pressure is well controlled at 132/80 mmHg. - Continue amlodipine  10 mg daily. - Continue losartan  50 mg daily.

## 2024-01-26 NOTE — Assessment & Plan Note (Signed)
 Gastroesophageal reflux disease (GERD) Chronic condition  GERD is managed with omeprazole . - Continue omeprazole  20 mg daily.

## 2024-01-26 NOTE — Progress Notes (Signed)
 Established patient visit   Patient: Laurie Watson   DOB: 12/12/1950   73 y.o. Female  MRN: [id number] Visit Date: 01/26/2024  Today's healthcare provider: Rockie Agent, MD   Chief Complaint  Patient presents with   Medical Management of Chronic Issues    Patient is present for follow up with pcp, reports feeling okay is having surgery done tomorrow, mammo and dexa this afternoon.    Diabetes    Patient would like to discuss moving to 7mg  mounjaro  if able    Subjective     HPI     Medical Management of Chronic Issues    Additional comments: Patient is present for follow up with pcp, reports feeling okay is having surgery done tomorrow, mammo and dexa this afternoon.         Diabetes    Additional comments: Patient would like to discuss moving to 7mg  mounjaro  if able       Last edited by Cherry Chiquita HERO, CMA on 01/26/2024  9:12 AM.       Discussed the use of AI scribe software for clinical note transcription with the patient, who gave verbal consent to proceed.  History of Present Illness Laurie Watson is a 73 year old female with type 2 diabetes, hypertension, and hyperlipidemia who presents for a chronic follow-up visit.  She is scheduled for right shoulder surgery tomorrow due to a fall at her niece's wedding, resulting in a biceps tear and a tear on the second tendon. She has been managing pain with tramadol 50 mg, half a tablet at night, and has stopped ibuprofen due to the upcoming surgery. She also uses Tylenol  as needed.  Her type 2 diabetes is well-controlled with a recent A1c of 5.8%, down from 5.9% in April. She continues on Mounjaro  5 mg weekly for diabetes management and monitors her blood sugar weekly, with readings between 106 and 115 mg/dL. She has a history of diabetic retinopathy, but recent eye exams show no significant changes.  Hypertension is managed with amlodipine  10 mg daily and losartan  50 mg daily.  Hyperlipidemia is  managed with simvastatin  20 mg daily and fish oil omega-3. Her LDL is 91 mg/dL, and her ASCVD risk score is 25%.  She has a history of vitamin D  deficiency and continues vitamin D  1000 mg daily. She also takes coenzyme Q10 for hyperlipidemia and colchicine  0.6 mg as needed for gout.  She experiences burning sensations in her leg, which she attributes to favoring her right side due to shoulder pain. She has not been taking gabapentin  100 mg for nerve pain recently but considers it if needed post-surgery.  She has a history of GERD and continues omeprazole  20 mg daily. She also uses Miralax occasionally for constipation, which she attributes to Mounjaro  use.  She received a flu shot recently and has a bone density test and mammogram scheduled for today. She has a history of cataracts, with no current need for surgery, and mentions a past eye injury from a golf ball in high school.  She is right-handed and has been limited in her activities due to shoulder pain, affecting her ability to crochet and knit. She enjoys cruises and plans to go on one at the end of January.     Past Medical History:  Diagnosis Date   Allergy    Diabetes mellitus without complication (HCC)    Family history of adverse reaction to anesthesia    Daughter admitted to hosp after  surgery(age 7). breathing difficulties, cholinesterase deficiency.   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   Coenzyme Q10 (COQ10 PO) Take by mouth.   colchicine  0.6 MG tablet Take 2 tabs (1.2mg ) by mouth at first sign of flare followed by 0.6mg   after 1 hour and then 1-2 tabs daily as needed until symptom resolution   COLLAGEN-VITAMIN C-BIOTIN PO Take by mouth. 6g per serving   docusate sodium (COLACE) 100 MG capsule Take 1 capsule by mouth daily.   gabapentin  (NEURONTIN ) 100 MG capsule Take 1 capsule (100 mg total) by mouth at bedtime.   glucose blood test strip To check blood sugar daily   losartan  (COZAAR ) 50 MG tablet Take 1 tablet (50 mg total) by mouth daily.   Microlet Lancets MISC Use once daily to check blood sugar   Omega-3 Fatty Acids (FISH OIL BURP-LESS) 1000 MG CAPS Take 1 capsule by mouth daily.   omeprazole  (PRILOSEC) 20 MG capsule TAKE 1 CAPSULE EVERY DAY   Probiotic CAPS Take 1 capsule by mouth daily.   simvastatin  (ZOCOR ) 20 MG tablet TAKE 1 TABLET EVERY DAY   [DISCONTINUED] aspirin 81 MG chewable tablet Chew 1 tablet by mouth daily.   [DISCONTINUED] tirzepatide  (MOUNJARO ) 5 MG/0.5ML Pen INJECT 5 MG INTO THE SKIN ONCE A WEEK.   No facility-administered medications prior to visit.    Review of Systems  Last metabolic panel Lab Results  Component Value Date   GLUCOSE 122 (H) 01/22/2024   NA 141 01/22/2024   K 4.0 01/22/2024   CL 103 01/22/2024   CO2 29 01/22/2024   BUN 15 01/22/2024   CREATININE 0.66 01/22/2024   GFRNONAA >60 01/22/2024   CALCIUM 9.2 01/22/2024   PROT 7.0 01/09/2022   ALBUMIN 4.8 01/09/2022   LABGLOB 2.2 01/09/2022   AGRATIO 2.2 01/09/2022   BILITOT 0.8 01/09/2022   ALKPHOS 82 01/09/2022   AST 39 01/09/2022   ALT 32 01/09/2022   ANIONGAP 9 01/22/2024   Last lipids Lab Results  Component Value Date   CHOL 174 01/22/2024   HDL 59 01/22/2024   LDLCALC 91 01/22/2024   TRIG 118 01/22/2024   CHOLHDL 2.9 01/22/2024   The 10-year ASCVD risk score (Arnett DK, et al., 2019) is: 25.6%  Last hemoglobin A1c Lab Results  Component Value Date   HGBA1C 5.8 (H) 01/22/2024        Objective    BP 132/80 (BP Location: Left Arm, Patient Position: Sitting, Cuff Size: Normal)   Pulse 69    Ht 5' 3 (1.6 m)   Wt 152 lb (68.9 kg)   SpO2 98%   BMI 26.93 kg/m   BP Readings from Last 3 Encounters:  01/26/24 132/80  07/16/23 131/74  06/29/23 (!) 125/51   Wt Readings from Last 3 Encounters:  01/26/24 152 lb (68.9 kg)  01/20/24 150 lb (68 kg)  07/16/23 146 lb (66.2 kg)       Physical Exam Vitals reviewed.  Constitutional:      General: She is not in acute distress.    Appearance:  Normal appearance. She is not ill-appearing.  Cardiovascular:     Rate and Rhythm: Normal rate and regular rhythm.  Pulmonary:     Effort: Pulmonary effort is normal. No respiratory distress.     Breath sounds: No wheezing, rhonchi or rales.  Musculoskeletal:     Comments: Holding right arm in internally rotated and elbow in flexion, able to move fingers of right upper extremity   Neurological:     Mental Status: She is alert and oriented to person, place, and time.  Psychiatric:        Mood and Affect: Mood normal.        Behavior: Behavior normal.       No results found for any visits on 01/26/24.  Assessment & Plan     Problem List Items Addressed This Visit     Acid reflux   Gastroesophageal reflux disease (GERD) Chronic condition  GERD is managed with omeprazole . - Continue omeprazole  20 mg daily.      Chronic gout involving toe of left foot without tophus    Chronic gout, left ankle and foot Gout is managed with colchicine  as needed. - Continue colchicine  0.6 mg as needed.      Diabetic retinopathy associated with controlled type 2 diabetes mellitus (HCC)   Relevant Medications   tirzepatide  (MOUNJARO ) 5 MG/0.5ML Pen   Drug-induced constipation   Drug-induced constipation, chronic intermittent Constipation is managed with Miralax as needed. - Continue Miralax as needed.      Hyperlipidemia associated with type 2 diabetes mellitus (HCC) (Chronic)   Hyperlipidemia Chronic  Associated withT2DM Lipids are within normal range with LDL at 91 mg/dL. ASCVD risk  score is elevated at 25%. - Continue simvastatin  20 mg daily. - Continue fish oil omega-3 supplements.      Relevant Medications   tirzepatide  (MOUNJARO ) 5 MG/0.5ML Pen   Hypertension associated with type 2 diabetes mellitus (HCC) - Primary (Chronic)   Chronic condition  Blood pressure is well controlled at 132/80 mmHg. - Continue amlodipine  10 mg daily. - Continue losartan  50 mg daily.      Relevant Medications   tirzepatide  (MOUNJARO ) 5 MG/0.5ML Pen   Type 2 diabetes mellitus with retinopathy (HCC)   Type 2 diabetes mellitus with stable diabetic retinopathy Chronic  Diabetes is well controlled with an A1c of 5.8, slightly increased from 5.7 last year but within the prediabetes range. Diabetic retinopathy is stable with no significant changes noted in the recent eye exam. - Continue Mounjaro  5 mg weekly for diabetes management. - Performed diabetes foot exam. - Monitor blood glucose levels weekly.      Relevant Medications   tirzepatide  (MOUNJARO ) 5 MG/0.5ML Pen   Other Relevant Orders   HM Diabetes Foot Exam (Completed)   Vitamin D  deficiency   Vitamin D  deficiency, Chronic  Vitamin D  supplementation is ongoing. - Continue vitamin D  1000 IU daily.        Assessment & Plan  Right shoulder rotator cuff tear, scheduled for surgical repair Scheduled for surgical repair of right shoulder rotator cuff tear. Post-operative recovery includes physical therapy twice a week starting November 12th. Tramadol is used for pain management, with potential addition of gabapentin  if needed. - Proceed with scheduled surgery for right shoulder rotator cuff repair. - Will initiate physical therapy twice a week starting November 12th. - Use tramadol for pain management post-operatively. - Consider gabapentin  for nerve pain if needed.  General Health Maintenance Routine health maintenance is up to date with recent flu vaccination  and scheduled DEXA and mammogram. - Complete DEXA and  mammogram today. - Continue routine health maintenance.     Return in about 11 months (around 12/22/2024) for DM, HTN, Chronic F/U.         Rockie Agent, MD  [facility] [phone] (phone) [phone] (fax)  [facility]

## 2024-01-26 NOTE — Assessment & Plan Note (Signed)
 Hyperlipidemia Chronic  Associated withT2DM Lipids are within normal range with LDL at 91 mg/dL. ASCVD risk score is elevated at 25%. - Continue simvastatin  20 mg daily. - Continue fish oil omega-3 supplements.

## 2024-01-26 NOTE — Assessment & Plan Note (Signed)
 Vitamin D  deficiency, Chronic  Vitamin D  supplementation is ongoing. - Continue vitamin D  1000 IU daily.

## 2024-01-26 NOTE — Patient Instructions (Signed)
 To keep you healthy, please keep in mind the following health maintenance items that you are due for:   Health Maintenance Due  Topic Date Due   DEXA SCAN  12/08/2022     Best Wishes,   Dr. Lang

## 2024-01-26 NOTE — Assessment & Plan Note (Signed)
  Chronic gout, left ankle and foot Gout is managed with colchicine  as needed. - Continue colchicine  0.6 mg as needed.

## 2024-01-26 NOTE — Assessment & Plan Note (Signed)
 Drug-induced constipation, chronic intermittent Constipation is managed with Miralax as needed. - Continue Miralax as needed.

## 2024-01-26 NOTE — Assessment & Plan Note (Signed)
 Type 2 diabetes mellitus with stable diabetic retinopathy Chronic  Diabetes is well controlled with an A1c of 5.8, slightly increased from 5.7 last year but within the prediabetes range. Diabetic retinopathy is stable with no significant changes noted in the recent eye exam. - Continue Mounjaro  5 mg weekly for diabetes management. - Performed diabetes foot exam. - Monitor blood glucose levels weekly.

## 2024-01-27 ENCOUNTER — Ambulatory Visit
Admission: RE | Admit: 2024-01-27 | Discharge: 2024-01-27 | Disposition: A | Discharge status: Home / Self Care | Attending: Surgery | Admitting: Surgery

## 2024-01-27 ENCOUNTER — Other Ambulatory Visit: Payer: Self-pay

## 2024-01-27 ENCOUNTER — Ambulatory Visit

## 2024-01-27 ENCOUNTER — Encounter: Payer: Self-pay | Admitting: Surgery

## 2024-01-27 ENCOUNTER — Ambulatory Visit: Payer: Self-pay | Admitting: Urgent Care

## 2024-01-27 ENCOUNTER — Encounter
Admission: RE | Disposition: A | Discharge status: Home / Self Care | Payer: Self-pay | Source: Home / Self Care | Attending: Surgery

## 2024-01-27 DIAGNOSIS — S46011A Strain of muscle(s) and tendon(s) of the rotator cuff of right shoulder, initial encounter: Secondary | ICD-10-CM | POA: Diagnosis not present

## 2024-01-27 DIAGNOSIS — E113293 Type 2 diabetes mellitus with mild nonproliferative diabetic retinopathy without macular edema, bilateral: Secondary | ICD-10-CM

## 2024-01-27 DIAGNOSIS — M7581 Other shoulder lesions, right shoulder: Secondary | ICD-10-CM | POA: Diagnosis not present

## 2024-01-27 DIAGNOSIS — E1169 Type 2 diabetes mellitus with other specified complication: Secondary | ICD-10-CM | POA: Diagnosis not present

## 2024-01-27 DIAGNOSIS — M7521 Bicipital tendinitis, right shoulder: Secondary | ICD-10-CM | POA: Diagnosis not present

## 2024-01-27 DIAGNOSIS — I10 Essential (primary) hypertension: Secondary | ICD-10-CM | POA: Diagnosis not present

## 2024-01-27 DIAGNOSIS — K219 Gastro-esophageal reflux disease without esophagitis: Secondary | ICD-10-CM | POA: Diagnosis not present

## 2024-01-27 DIAGNOSIS — E119 Type 2 diabetes mellitus without complications: Secondary | ICD-10-CM | POA: Insufficient documentation

## 2024-01-27 DIAGNOSIS — G8918 Other acute postprocedural pain: Secondary | ICD-10-CM | POA: Diagnosis not present

## 2024-01-27 DIAGNOSIS — K449 Diaphragmatic hernia without obstruction or gangrene: Secondary | ICD-10-CM | POA: Diagnosis not present

## 2024-01-27 DIAGNOSIS — F1090 Alcohol use, unspecified, uncomplicated: Secondary | ICD-10-CM | POA: Diagnosis not present

## 2024-01-27 DIAGNOSIS — S43431A Superior glenoid labrum lesion of right shoulder, initial encounter: Secondary | ICD-10-CM | POA: Diagnosis not present

## 2024-01-27 DIAGNOSIS — M24111 Other articular cartilage disorders, right shoulder: Secondary | ICD-10-CM | POA: Diagnosis not present

## 2024-01-27 DIAGNOSIS — X58XXXA Exposure to other specified factors, initial encounter: Secondary | ICD-10-CM | POA: Insufficient documentation

## 2024-01-27 DIAGNOSIS — E785 Hyperlipidemia, unspecified: Secondary | ICD-10-CM | POA: Diagnosis not present

## 2024-01-27 DIAGNOSIS — Z7985 Long-term (current) use of injectable non-insulin antidiabetic drugs: Secondary | ICD-10-CM | POA: Insufficient documentation

## 2024-01-27 DIAGNOSIS — Z01818 Encounter for other preprocedural examination: Secondary | ICD-10-CM

## 2024-01-27 DIAGNOSIS — E11319 Type 2 diabetes mellitus with unspecified diabetic retinopathy without macular edema: Secondary | ICD-10-CM | POA: Diagnosis not present

## 2024-01-27 HISTORY — PX: BICEPS TENDON REPAIR: SHX566

## 2024-01-27 HISTORY — PX: POSTERIOR LUMBAR FUSION 2 WITH HARDWARE REMOVAL: SHX7297

## 2024-01-27 LAB — GLUCOSE, CAPILLARY
Glucose-Capillary: 102 mg/dL — ABNORMAL HIGH (ref 70–99)
Glucose-Capillary: 136 mg/dL — ABNORMAL HIGH (ref 70–99)

## 2024-01-27 SURGERY — ARTHROSCOPY, SHOULDER WITH DEBRIDEMENT
Anesthesia: General | Site: Shoulder | Laterality: Right

## 2024-01-27 MED ORDER — FENTANYL CITRATE (PF) 100 MCG/2ML IJ SOLN
INTRAMUSCULAR | Status: AC
  Filled 2024-01-27: qty 2

## 2024-01-27 MED ORDER — EPINEPHRINE PF 1 MG/ML IJ SOLN
INTRAMUSCULAR | Status: AC
  Filled 2024-01-27: qty 1

## 2024-01-27 MED ORDER — FENTANYL CITRATE (PF) 100 MCG/2ML IJ SOLN: 25.0000 ug | INTRAMUSCULAR | Status: DC | PRN

## 2024-01-27 MED ORDER — PROPOFOL 1000 MG/100ML IV EMUL
INTRAVENOUS | Status: AC
  Filled 2024-01-27: qty 100

## 2024-01-27 MED ORDER — METOCLOPRAMIDE HCL 10 MG PO TABS: 5.0000 mg | ORAL_TABLET | Freq: Three times a day (TID) | ORAL | Status: DC | PRN

## 2024-01-27 MED ORDER — PROPOFOL 500 MG/50ML IV EMUL
INTRAVENOUS | Status: DC | PRN
  Administered 2024-01-27: 125 ug/kg/min via INTRAVENOUS

## 2024-01-27 MED ORDER — OXYCODONE HCL 5 MG PO TABS: 5.0000 mg | ORAL_TABLET | ORAL | 0 refills | Status: AC | PRN

## 2024-01-27 MED ORDER — SODIUM CHLORIDE 0.9 % IV SOLN: INTRAVENOUS | Status: DC

## 2024-01-27 MED ORDER — BUPIVACAINE-EPINEPHRINE (PF) 0.5% -1:200000 IJ SOLN
INTRAMUSCULAR | Status: AC
Start: 2024-01-27 — End: 2024-01-27
  Filled 2024-01-27: qty 30

## 2024-01-27 MED ORDER — BUPIVACAINE LIPOSOME 1.3 % IJ SUSP
INTRAMUSCULAR | Status: AC
  Filled 2024-01-27: qty 20

## 2024-01-27 MED ORDER — ACETAMINOPHEN 325 MG PO TABS: 325.0000 mg | ORAL_TABLET | Freq: Four times a day (QID) | ORAL | Status: DC | PRN

## 2024-01-27 MED ORDER — ROCURONIUM BROMIDE 100 MG/10ML IV SOLN
INTRAVENOUS | Status: DC | PRN
  Administered 2024-01-27: 50 mg via INTRAVENOUS
  Administered 2024-01-27: 10 mg via INTRAVENOUS

## 2024-01-27 MED ORDER — BUPIVACAINE HCL (PF) 0.5 % IJ SOLN
INTRAMUSCULAR | Status: DC | PRN
  Administered 2024-01-27: 10 mL via PERINEURAL

## 2024-01-27 MED ORDER — METOCLOPRAMIDE HCL 5 MG/ML IJ SOLN: 5.0000 mg | Freq: Three times a day (TID) | INTRAMUSCULAR | Status: DC | PRN

## 2024-01-27 MED ORDER — KETOROLAC TROMETHAMINE 15 MG/ML IJ SOLN
INTRAMUSCULAR | Status: AC
  Filled 2024-01-27: qty 1

## 2024-01-27 MED ORDER — BUPIVACAINE-EPINEPHRINE (PF) 0.5% -1:200000 IJ SOLN
INTRAMUSCULAR | Status: DC | PRN
  Administered 2024-01-27: 30 mL

## 2024-01-27 MED ORDER — ONDANSETRON HCL 4 MG/2ML IJ SOLN
INTRAMUSCULAR | Status: DC | PRN
  Administered 2024-01-27: 4 mg via INTRAVENOUS

## 2024-01-27 MED ORDER — MIDAZOLAM HCL 2 MG/2ML IJ SOLN
INTRAMUSCULAR | Status: AC
  Filled 2024-01-27: qty 2

## 2024-01-27 MED ORDER — KETOROLAC TROMETHAMINE 15 MG/ML IJ SOLN
15.0000 mg | Freq: Once | INTRAMUSCULAR | Status: AC
  Administered 2024-01-27: 15 mg via INTRAVENOUS

## 2024-01-27 MED ORDER — BUPIVACAINE HCL (PF) 0.5 % IJ SOLN
INTRAMUSCULAR | Status: AC
  Filled 2024-01-27: qty 10

## 2024-01-27 MED ORDER — OXYCODONE HCL 5 MG PO TABS
5.0000 mg | ORAL_TABLET | ORAL | 0 refills | Status: AC | PRN
  Filled 2024-01-27: qty 40, 4d supply, fill #0

## 2024-01-27 MED ORDER — ONDANSETRON HCL 4 MG/2ML IJ SOLN: 4.0000 mg | Freq: Four times a day (QID) | INTRAMUSCULAR | Status: DC | PRN

## 2024-01-27 MED ORDER — RINGERS IRRIGATION IR SOLN
Status: DC | PRN
Start: 2024-01-27 — End: 2024-01-27
  Administered 2024-01-27: 2000 mL

## 2024-01-27 MED ORDER — FENTANYL CITRATE (PF) 50 MCG/ML IJ SOSY
PREFILLED_SYRINGE | INTRAMUSCULAR | Status: AC
  Filled 2024-01-27: qty 1

## 2024-01-27 MED ORDER — OXYCODONE HCL 5 MG PO TABS
ORAL_TABLET | ORAL | Status: AC
  Filled 2024-01-27: qty 1

## 2024-01-27 MED ORDER — ONDANSETRON HCL 4 MG PO TABS
4.0000 mg | ORAL_TABLET | Freq: Four times a day (QID) | ORAL | Status: DC | PRN
Start: 2024-01-27 — End: 2024-01-27

## 2024-01-27 MED ORDER — OXYCODONE HCL 5 MG PO TABS: 5.0000 mg | ORAL_TABLET | ORAL | Status: DC | PRN

## 2024-01-27 MED ORDER — BUPIVACAINE LIPOSOME 1.3 % IJ SUSP
INTRAMUSCULAR | Status: DC | PRN
  Administered 2024-01-27: 20 mL via PERINEURAL

## 2024-01-27 MED ORDER — LIDOCAINE HCL (PF) 1 % IJ SOLN
INTRAMUSCULAR | Status: AC
  Filled 2024-01-27: qty 5

## 2024-01-27 MED ORDER — PROPOFOL 10 MG/ML IV BOLUS
INTRAVENOUS | Status: DC | PRN
  Administered 2024-01-27: 120 mg via INTRAVENOUS

## 2024-01-27 MED ORDER — OXYCODONE HCL 5 MG PO TABS
5.0000 mg | ORAL_TABLET | Freq: Once | ORAL | Status: AC | PRN
  Administered 2024-01-27: 5 mg via ORAL

## 2024-01-27 MED ORDER — LIDOCAINE HCL (CARDIAC) PF 100 MG/5ML IV SOSY
PREFILLED_SYRINGE | INTRAVENOUS | Status: DC | PRN
  Administered 2024-01-27: 100 mg via INTRAVENOUS

## 2024-01-27 MED ORDER — CEFAZOLIN SODIUM-DEXTROSE 2-4 GM/100ML-% IV SOLN
INTRAVENOUS | Status: AC
  Filled 2024-01-27: qty 100

## 2024-01-27 MED ORDER — CHLORHEXIDINE GLUCONATE 0.12 % MT SOLN
OROMUCOSAL | Status: AC
  Filled 2024-01-27: qty 15

## 2024-01-27 MED ORDER — FENTANYL CITRATE (PF) 50 MCG/ML IJ SOSY
50.0000 ug | PREFILLED_SYRINGE | Freq: Once | INTRAMUSCULAR | Status: AC
  Administered 2024-01-27: 50 ug via INTRAVENOUS

## 2024-01-27 MED ORDER — SUGAMMADEX SODIUM 200 MG/2ML IV SOLN
INTRAVENOUS | Status: DC | PRN
  Administered 2024-01-27: 200 mg via INTRAVENOUS

## 2024-01-27 MED ORDER — OXYCODONE HCL 5 MG/5ML PO SOLN: 5.0000 mg | Freq: Once | ORAL | Status: AC | PRN

## 2024-01-27 MED ORDER — MIDAZOLAM HCL (PF) 2 MG/2ML IJ SOLN
1.0000 mg | INTRAMUSCULAR | Status: DC | PRN
  Administered 2024-01-27: 1 mg via INTRAVENOUS

## 2024-01-27 MED ORDER — LACTATED RINGERS IV SOLN
INTRAVENOUS | Status: DC | PRN
  Administered 2024-01-27: 3001 mL

## 2024-01-27 MED ORDER — DEXAMETHASONE SOD PHOSPHATE PF 10 MG/ML IJ SOLN
INTRAMUSCULAR | Status: DC | PRN
  Administered 2024-01-27: 10 mg via INTRAVENOUS

## 2024-01-27 MED ORDER — FENTANYL CITRATE (PF) 100 MCG/2ML IJ SOLN
INTRAMUSCULAR | Status: DC | PRN
  Administered 2024-01-27 (×2): 50 ug via INTRAVENOUS

## 2024-01-27 SURGICAL SUPPLY — 60 items
ANCHOR BONE REGENETEN (Anchor) IMPLANT
ANCHOR HEALICOIL REGEN 5.5 (Anchor) IMPLANT
ANCHOR JUGGERKNOT WTAP NDL 2.9 (Anchor) IMPLANT
ANCHOR QFIX 2.8 SUT MINI TAPE (Anchor) IMPLANT
ANCHOR QFIX KTLS 1.8 MINI BLUE (Anchor) IMPLANT
ANCHOR TENDON REGENETEN (Staple) IMPLANT
BIT DRILL JUGRKNT W/NDL BIT2.9 (DRILL) IMPLANT
BLADE FULL RADIUS 3.5 (BLADE) ×1 IMPLANT
BNDG COHESIVE 4X5 TAN STRL LF (GAUZE/BANDAGES/DRESSINGS) ×1 IMPLANT
BUR ACROMIONIZER 4.0 (BURR) ×1 IMPLANT
CHLORAPREP W/TINT 26 (MISCELLANEOUS) ×1 IMPLANT
COVER MAYO STAND STRL (DRAPES) ×1 IMPLANT
DILATOR 5.5 THREADED HEALICOIL (MISCELLANEOUS) IMPLANT
DRAPE INCISE IOBAN 66X45 STRL (DRAPES) ×1 IMPLANT
DRAPE SURG ORHT 6 SPLT 77X108 (DRAPES) ×1 IMPLANT
DRSG OPSITE POSTOP 4X6 (GAUZE/BANDAGES/DRESSINGS) ×1 IMPLANT
ELECTRODE REM PT RTRN 9FT ADLT (ELECTROSURGICAL) ×1 IMPLANT
GAUZE SPONGE 4X4 12PLY STRL (GAUZE/BANDAGES/DRESSINGS) ×1 IMPLANT
GAUZE XEROFORM 1X8 LF (GAUZE/BANDAGES/DRESSINGS) ×1 IMPLANT
GLOVE BIO SURGEON STRL SZ7.5 (GLOVE) ×2 IMPLANT
GLOVE BIO SURGEON STRL SZ8 (GLOVE) ×2 IMPLANT
GLOVE BIOGEL PI IND STRL 8 (GLOVE) ×2 IMPLANT
GLOVE INDICATOR 8.0 STRL GRN (GLOVE) ×1 IMPLANT
GOWN STRL REUS W/ TWL LRG LVL3 (GOWN DISPOSABLE) ×1 IMPLANT
GOWN STRL REUS W/ TWL XL LVL3 (GOWN DISPOSABLE) ×1 IMPLANT
IMMOBILIZER SHDR LG 32-36 (SOFTGOODS) ×1 IMPLANT
IMPL REGENETEN MEDIUM (Shoulder) IMPLANT
IV LR IRRIG 3000ML ARTHROMATIC (IV SOLUTION) ×1 IMPLANT
KIT CANNULA 8X76-LX IN CANNULA (CANNULA) ×1 IMPLANT
KIT STABILIZATION SHOULDER (MISCELLANEOUS) ×1 IMPLANT
KIT SUTURE 1.8 Q-FIX DISP (KITS) IMPLANT
KIT SUTURE 2.8 Q-FIX DISP (MISCELLANEOUS) IMPLANT
KIT TURNOVER KIT A (KITS) ×1 IMPLANT
MANIFOLD NEPTUNE II (INSTRUMENTS) ×2 IMPLANT
MASK FACE SPIDER DISP (MASK) ×1 IMPLANT
MAT ABSORB FLUID 56X50 GRAY (MISCELLANEOUS) ×1 IMPLANT
PACK ARTHROSCOPY SHOULDER (MISCELLANEOUS) ×1 IMPLANT
PAD ABD DERMACEA PRESS 5X9 (GAUZE/BANDAGES/DRESSINGS) ×2 IMPLANT
PAD COLD SHLDR SM WRAP-ON (PAD) IMPLANT
PASSER SUT FIRSTPASS SELF (INSTRUMENTS) IMPLANT
PASSER SUT SHUTTLE 45D CVD LFT (SUTURE) IMPLANT
PENCIL SMOKE EVACUATOR (MISCELLANEOUS) IMPLANT
SLING ARM LARGE (SOFTGOODS) ×1 IMPLANT
SLING ULTRA II LG (MISCELLANEOUS) ×1 IMPLANT
SLING ULTRA II M (MISCELLANEOUS) IMPLANT
SPONGE T-LAP 18X18 ~~LOC~~+RFID (SPONGE) ×1 IMPLANT
STAPLER SKIN PROX 35W (STAPLE) ×1 IMPLANT
STRAP SAFETY 5IN WIDE (MISCELLANEOUS) ×1 IMPLANT
STRIP CLOSURE SKIN 1/2X4 (GAUZE/BANDAGES/DRESSINGS) ×1 IMPLANT
SUT ETHIBOND 0 MO6 C/R (SUTURE) ×1 IMPLANT
SUT ULTRABRAID #2 38 (SUTURE) IMPLANT
SUT ULTRABRAID 2 COBRAID 38 (SUTURE) IMPLANT
SUT VIC AB 2-0 CT1 TAPERPNT 27 (SUTURE) ×2 IMPLANT
SUT VIC AB 2-0 CT2 27 (SUTURE) ×2 IMPLANT
TAPE MICROFOAM 4IN (TAPE) ×1 IMPLANT
TRAP FLUID SMOKE EVACUATOR (MISCELLANEOUS) ×1 IMPLANT
TUBE SET DOUBLEFLO INFLOW (TUBING) ×1 IMPLANT
TUBING CONNECTING 10 (TUBING) IMPLANT
WAND WEREWOLF FLOW 90D (MISCELLANEOUS) ×1 IMPLANT
WATER STERILE IRR 500ML POUR (IV SOLUTION) ×1 IMPLANT

## 2024-01-27 NOTE — Discharge Instructions (Addendum)
 Orthopedic discharge instructions: Keep dressing dry and intact.  May shower after dressing changed on post-op day #4 (Sunday).  Cover staples/sutures with Band-Aids after drying off. Apply ice frequently to shoulder. Take ibuprofen 600-800 mg TID with meals for 3-5 days, then as necessary. Take oxycodone  as prescribed when needed.  May supplement with ES Tylenol  if necessary. Keep shoulder immobilizer on at all times except may remove for bathing purposes. Follow-up in 10-14 days or as scheduled.     Interscalene Nerve Block with Exparel   For your surgery you have received an Interscalene Nerve Block with Exparel. Nerve Blocks affect many types of nerves, including nerves that control movement, pain and normal sensation.  You may experience feelings such as numbness, tingling, heaviness, weakness or the inability to move your arm or the feeling or sensation that your arm has fallen asleep. A nerve block with Exparel can last up to 5 days.  Usually the weakness wears off first.  The tingling and heaviness usually wear off next.  Finally you may start to notice pain.  Keep in mind that this may occur in any order.  Once a nerve block starts to wear off it is usually completely gone within 60 minutes. ISNB may cause mild shortness of breath, a hoarse voice, blurry vision, unequal pupils, or drooping of the face on the same side as the nerve block.  These symptoms will usually resolve with the numbness.  Very rarely the procedure itself can cause mild seizures. If needed, your surgeon will give you a prescription for pain medication.  It will take about 60 minutes for the oral pain medication to become fully effective.  So, it is recommended that you start taking this medication before the nerve block first begins to wear off, or when you first begin to feel discomfort. Take your pain medication only as prescribed.  Pain medication can cause sedation and decrease your breathing if you take more  than you need for the level of pain that you have. Nausea is a common side effect of many pain medications.  You may want to eat something before taking your pain medicine to prevent nausea. After an Interscalene nerve block, you cannot feel pain, pressure or extremes in temperature in the effected arm.  Because your arm is numb it is at an increased risk for injury.  To decrease the possibility of injury, please practice the following:  While you are awake change the position of your arm frequently to prevent too much pressure on any one area for prolonged periods of time.  If you have a cast or tight dressing, check the color or your fingers every couple of hours.  Call your surgeon with the appearance of any discoloration (white or blue). If you are given a sling to wear before you go home, please wear it  at all times until the block has completely worn off.  Do not get up at night without your sling. Please contact [facility] or your surgeon if you do not begin to regain sensation after 7 days from the surgery.  Anesthesia may be contacted by calling the Same Day Surgery Department, Mon. through Fri., 6 am to 4 pm at [phone].   If you experience any other problems or concerns, please contact your surgeon's office. If you experience severe or prolonged shortness of breath go to the nearest emergency department.  SHOULDER SLING IMMOBILIZER   VIDEO Slingshot 2 Shoulder Brace Application - YouTube ---[email or web address]  INSTRUCTIONS While supporting the  injured arm, slide the forearm into the sling. Wrap the adjustable shoulder strap around the neck and shoulders and attach the strap end to the sling using  the "alligator strap tab."  Adjust the shoulder strap to the required length. Position the shoulder pad behind the neck. To secure the shoulder pad location (optional), pull the shoulder strap away from the shoulder pad, unfold the hook material on the top  of the pad, then press the shoulder strap back onto the hook material to secure the pad in place. Attach the closure strap across the open top of the sling. Position the strap so that it holds the arm securely in the sling. Next, attach the thumb strap to the open end of the sling between the thumb and fingers. After sling has been fit, it may be easily removed and reapplied using the quick release buckle on shoulder strap. If a neutral pillow or 15 abduction pillow is included, place the pillow at the waistline. Attach the sling to the pillow, lining up hook material on the pillow with the loop on sling. Adjust the waist strap to fit.  If waist strap is too long, cut it to fit. Use the small piece of double sided hook material (located on top of the pillow) to secure the strap end. Place the double sided hook material on the inside of the cut strap end and secure it to the waist strap.     If no pillow is included, attach the waist strap to the sling and adjust to fit.    Washing Instructions: Straps and sling must be removed and cleaned regularly depending on your activity level and perspiration. Hand wash straps and sling in cold water with mild detergent, rinse, air dry     Interscalene Nerve Block (ISNB) Discharge Instructions    For your surgery you have received an Interscalene Nerve Block. Nerve Blocks affect many types of nerves, including nerves that control movement, pain and normal sensation.  You may experience feelings such as numbness, tingling, heaviness, weakness or the inability to move your arm or the feeling or sensation that your arm has fallen asleep. A nerve block can last for 2 - 36 hours or more depending on the medication used.  Usually the weakness wears off first.  The tingling and heaviness usually wear off next.  Finally you may start to notice pain.  Keep in mind that this may occur in any order.  once a nerve block starts to wear off it is usually completely gone  within 60 minutes. ISNB may cause mild shortness of breath, a hoarse voice, blurry vision, unequal pupils, or drooping of the face on the same side as the nerve block.  These symptoms will usually go away within 12 hours.  Very rarely the procedure itself can cause mild seizures. If needed, your surgeon will give you a prescription for pain medication.  It will take about 60 minutes for the oral pain medication to become fully effective.  So, it is recommended that you start taking this medication before the nerve block first begins to wear off, or when you first begin to feel discomfort. Keep in mind that nerve blocks often wear off in the middle of the night.   If you are going to bed and the block has not started to wear off or you have not started to have any discomfort, consider setting an alarm for 2 to 3 hours, so you can assess your block.  If you notice  the block is wearing off or you are starting to have discomfort, you can take your pain medication. Take your pain medication only as prescribed.  Pain medication can cause sedation and decrease your breathing if you take more than you need for the level of pain that you have. Nausea is a common side effect of many pain medications.  You may want to eat something before taking your pain medicine to prevent nausea. After an Interscalene nerve block, you cannot feel pain, pressure or extremes in temperature in the effected arm.  Because your arm is numb it is at an increased risk for injury.  To decrease the possibility of injury, please practice the following:  While you are awake change the position of your arm frequently to prevent too much pressure on any one area for prolonged periods of time.  If you have a cast or tight dressing, check the color or your fingers every couple of hours.  Call your surgeon with the appearance of any discoloration (white or blue). If you are given a sling to wear before you go home, please wear it  at all times  until the block has completely worn off.  Do not get up at night without your sling. If you experience any problems or concerns, please contact your surgeon's office. If you experience severe or prolonged shortness of breath go to the nearest emergency department.

## 2024-01-27 NOTE — Anesthesia Procedure Notes (Signed)
 Procedure Name: Intubation Date/Time: 01/27/2024 11:27 AM  Performed by: Niki Manus SAUNDERS, CRNAPre-anesthesia Checklist: Patient identified, Patient being monitored, Timeout performed, Emergency Drugs available and Suction available Patient Re-evaluated:Patient Re-evaluated prior to induction Oxygen Delivery Method: Circle system utilized Preoxygenation: Pre-oxygenation with 100% oxygen Induction Type: IV induction Ventilation: Mask ventilation without difficulty Laryngoscope Size: 3 and McGrath Grade View: Grade I Tube type: Oral Tube size: 7.0 mm Number of attempts: 1 Airway Equipment and Method: Stylet Placement Confirmation: ETT inserted through vocal cords under direct vision, positive ETCO2 and breath sounds checked- equal and bilateral Secured at: 21 cm Tube secured with: Tape Dental Injury: Teeth and Oropharynx as per pre-operative assessment

## 2024-01-27 NOTE — Op Note (Addendum)
 01/27/2024  1:50 PM  Patient:   Laurie Watson  Pre-Op Diagnosis:   Traumatic full-thickness rotator cuff tear, right shoulder.  Post-Op Diagnosis:   Traumatic full-thickness rotator cuff tear with labral fraying and biceps tendinopathy, right shoulder.  Procedure:   Extensive arthroscopic debridement, arthroscopic subscapularis tendon repair, arthroscopic subacromial decompression, mini-open supraspinatus repair supplemented with a Smith & Nephew Regeneten patch, and mini-open biceps tenodesis, right shoulder.  Anesthesia:   General endotracheal with interscalene block using Exparel placed preoperatively by the anesthesiologist.  Surgeon:   DOROTHA Reyes Maltos, MD  Assistant:   Annabella Gambler, RNFA  Findings:   As above. There was a near full-thickness tear involving the superior insertional fibers of the subscapularis tendon, as well as a full-thickness tear involving the entire insertional fibers of the supraspinatus tendon extending into the anterior fibers of the infraspinatus tendon. The remaining portions of the rotator cuff all were in satisfactory condition. There was moderate fraying of the superior portion of the labrum without frank detachment from the glenoid rim. The biceps tendon demonstrated some striations without significant partial-thickness tearing or significant inflammation. There were grade I chondromalacia changes involving the glenoid articular surface. The humeral articular surface was in satisfactory condition.  Complications:   None  Estimated blood loss:   25 cc  Fluids:   900 cc  Tourniquet time:   None  Drains:   None  Closure:   Staples      Brief clinical note:   The patient is a 73 year old female who slipped and fell about 4 weeks ago while attending a wedding in [place] , injuring her right shoulder. The patient's symptoms of pain and weakness have persisted despite medications, activity modification, etc. The patient's history and  examination are consistent with a traumatic rotator cuff tear. These findings were confirmed by MRI scan. The patient presents at this time for definitive management of these shoulder symptoms.  Procedure:   The patient underwent placement of an interscalene block using Exparel by the anesthesiologist in the preoperative holding area before being brought into the operating room and lain in the supine position. The patient then underwent general endotracheal intubation and anesthesia before being repositioned in the beach chair position using the beach chair positioner. The right shoulder and upper extremity were prepped with ChloraPrep solution before being draped sterilely. Preoperative antibiotics were administered. A timeout was performed to confirm the proper surgical site before the expected portal sites and incision site were injected with 0.5% Sensorcaine with epinephrine.   A posterior portal was created and the glenohumeral joint thoroughly inspected with the findings as described above. An anterior portal was created using an outside-in technique. The labrum and rotator cuff were further probed, again confirming the above-noted findings. The areas of labral fraying were debrided back to stable margins using the full-radius resector. The torn portions of the rotator cuff also were debrided using the full-radius resector, as were areas of extensive synovitis. The ArthroCare wand was inserted and used to release the biceps tendon. It also was used to obtain hemostasis as well as to anneal the labrum superiorly and anteriorly.   The superior portion of the subscapularis tendon was repaired back to the lesser tuberosity using a single Smith & Nephew 1.8 mm Q-Fix Knotless all suture anchor. This was inserted through the anterior portal after creating a separate superolateral portal using an outside in technique to serve as a working portal. The adequacy of repair was verified both with passive external  rotation, as  well as wound direct probing of the repair and found to be excellent. The instruments were removed from the joint after suctioning the excess fluid.  The camera was repositioned through the posterior portal into the subacromial space. A separate lateral portal was created using an outside-in technique. The 3.5 mm full-radius resector was introduced and used to perform a subtotal bursectomy. The ArthroCare wand was then inserted and used to remove the periosteal tissue off the undersurface of the anterior third of the acromion as well as to recess the coracoacromial ligament from its attachment along the anterior and lateral margins of the acromion. The 4.0 mm acromionizing bur was introduced and used to complete the decompression by removing the undersurface of the anterior third of the acromion. The full radius resector was reintroduced to remove any residual bony debris before the ArthroCare wand was reintroduced to obtain hemostasis. The instruments were then removed from the subacromial space after suctioning the excess fluid.  An approximately 4-5 cm incision was made over the anterolateral aspect of the shoulder beginning at the anterolateral corner of the acromion and extending distally in line with the bicipital groove. This incision was carried down through the subcutaneous tissues to expose the deltoid fascia. The raphae between the anterior and middle thirds was identified and this plane developed to provide access into the subacromial space. Additional bursal tissues were debrided sharply using Metzenbaum scissors. The rotator cuff tear was readily identified.   The bicipital groove was identified by palpation and opened for 1-1.5 cm. The biceps tendon stump was retrieved through this defect. The floor of the bicipital groove was roughened with a curet before a Biomet 2.9 mm JuggerKnot anchor was inserted. Both sets of sutures were passed through the biceps tendon and tied securely to  effect the tenodesis. The bicipital sheath was reapproximated using two #0 Ethibond interrupted sutures, incorporating the biceps tendon to further reinforce the tenodesis.  The margins of the rotator cuff tear were debrided sharply with a #15 blade and the exposed greater tuberosity roughened with a rongeur. The tear was repaired using two Smith & Nephew 2.8 mm Q-Fix anchors. These sutures were then brought back laterally and secured using two Smith & Nephew Healicoil knotless RegeneSorb anchors to create a two-layer closure. In addition, several #0 Ethibond interrupted sutures were used to perform a side-to-side closure of the more vertical component of the tear. An apparent watertight closure was obtained.    Given the quality of the tissue and the size of the tear, it was felt best to reinforce this repair using a Smith & Nephew regenerative patch in order to optimize the healing potential of the repaired tendon. Therefore, a medium sized patch was applied over the repair and secured using appropriate bony and soft tissue staples. This additional procedure added a measure of complexity to the case as well as 20 to 30 minutes of extra surgical time.  The wound was copiously irrigated with sterile saline solution before the deltoid raphae was reapproximated using 2-0 Vicryl interrupted sutures. The subcutaneous tissues were closed in two layers using 2-0 Vicryl interrupted sutures before the skin was closed using staples. The portal sites also were closed using staples. A sterile bulky dressing was applied to the shoulder.  A Polar Care device was applied over the shoulder before the arm was placed into a shoulder immobilizer. The patient was then awakened, extubated, and returned to the recovery room in satisfactory condition after tolerating the procedure well.

## 2024-01-27 NOTE — Anesthesia Preprocedure Evaluation (Signed)
 Anesthesia Evaluation  Patient identified by MRN, date of birth, ID band Patient awake    Reviewed: Allergy & Precautions, NPO status , Patient's Chart, lab work & pertinent test results  History of Anesthesia Complications (+) PONV, PSEUDOCHOLINESTERASE DEFICIENCY and history of anesthetic complications  Airway Mallampati: III  TM Distance: <3 FB Neck ROM: full    Dental  (+) Chipped   Pulmonary neg pulmonary ROS, neg shortness of breath   Pulmonary exam normal        Cardiovascular Exercise Tolerance: Good hypertension, (-) angina (-) Past MI Normal cardiovascular exam     Neuro/Psych negative neurological ROS  negative psych ROS   GI/Hepatic Neg liver ROS, hiatal hernia,GERD  Controlled,,  Endo/Other  diabetes, Type 2    Renal/GU      Musculoskeletal   Abdominal   Peds  Hematology negative hematology ROS (+)   Anesthesia Other Findings Past Medical History: No date: Allergy No date: Diabetes mellitus without complication (HCC) No date: Family history of adverse reaction to anesthesia     Comment:  Daughter admitted to hosp after surgery(age 11).               breathing difficulties, cholinesterase deficiency. 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) No date: Right rotator cuff tear  Past Surgical History: 03/12/2018: ACHILLES TENDON SURGERY; Right     Comment:  Procedure: ACHILLES TENDON REPAIR-SECONDARY;  Surgeon:               Lilli Cough, DPM;  Location: [facility];                Service: Podiatry;  Laterality: Right;  tendon               anchors lma with popliteal block 11/13/2013: ANAL FISSURE REPAIR 1990's: BREAST BIOPSY; Right     Comment:  Core - neg 01/29/2021: BREAST  BIOPSY; Right     Comment:  Stereo Bx, Coil Clip, path pending 03/12/2018: CALCANEAL OSTEOTOMY; Right     Comment:  Procedure: PARTIAL CALCANECTOMY;  Surgeon: Lilli Cough, DPM;  Location: [facility];  Service:               Podiatry;  Laterality: Right;  Prediabetic 05/05/2011: COLONOSCOPY WITH ESOPHAGOGASTRODUODENOSCOPY (EGD) 01/18/2018: COLONOSCOPY WITH PROPOFOL ; N/A     Comment:  Procedure: COLONOSCOPY WITH PROPOFOL ;  Surgeon: Viktoria Lamar DASEN, MD;  Location: [facility];  Service:               Endoscopy;  Laterality: N/A; 06/29/2023: COLONOSCOPY WITH PROPOFOL ; N/A     Comment:  Procedure: COLONOSCOPY WITH PROPOFOL ;  Surgeon: Onita Elspeth Sharper, DO;  Location: [facility] END[facility]COPY;  Service:               Gastroenterology;  Laterality: N/A;  DM No date: ELBOW SURGERY; Left 1972: EYE SURGERY; Left No date: KNEE SURGERY; Left 06/29/2023: POLYPECTOMY     Comment:  Procedure: POLYPECTOMY, INTESTINE;  Surgeon: Onita Elspeth Sharper, DO;  Location: [facility] ENDOSCOPY;  Service:               Gastroenterology;; 1970: TONSILLECTOMY AND ADENOIDECTOMY No date: TUBAL LIGATION     Reproductive/Obstetrics negative OB ROS                              Anesthesia Physical Anesthesia Plan  ASA: 3  Anesthesia Plan: General ETT   Post-op Pain Management: Regional block*   Induction: Intravenous  PONV Risk Score and Plan: Ondansetron , Dexamethasone, Midazolam  and Treatment may vary due to age or medical condition  Airway Management Planned: Oral ETT  Additional Equipment:   Intra-op Plan:   Post-operative Plan: Extubation in OR  Informed Consent: I have reviewed the patients History and Physical, chart, labs and discussed the procedure including the risks, benefits and alternatives for the proposed anesthesia with the patient or authorized representative who has indicated his/her understanding  and acceptance.     Dental Advisory Given  Plan Discussed with: Anesthesiologist, CRNA and Surgeon  Anesthesia Plan Comments: (Patient consented for risks of anesthesia including but not limited to:  - adverse reactions to medications - damage to eyes, teeth, lips or other oral mucosa - nerve damage due to positioning  - sore throat or hoarseness - Damage to heart, brain, nerves, lungs, other parts of body or loss of life  Patient voiced understanding and assent.)        Anesthesia Quick Evaluation

## 2024-01-27 NOTE — Anesthesia Postprocedure Evaluation (Signed)
 Anesthesia Post Note  Patient: Laurie Watson  Procedure(s) Performed: ARTHROSCOPY, SHOULDER WITH DEBRIDEMENT (Right: Shoulder) REPAIR, TENDON, BICEPS, PROXIMAL (Right: Shoulder)  Patient location during evaluation: PACU Anesthesia Type: General Level of consciousness: awake and alert Pain management: pain level controlled Vital Signs Assessment: post-procedure vital signs reviewed and stable Respiratory status: spontaneous breathing, nonlabored ventilation and respiratory function stable Cardiovascular status: blood pressure returned to baseline and stable Postop Assessment: no apparent nausea or vomiting Anesthetic complications: no   No notable events documented.   Last Vitals:  Vitals:   01/27/24 1430 01/27/24 1500  BP: 139/61 117/67  Pulse: 76 83  Resp: 16 16  Temp:  36.9 C  SpO2: 94% 93%    Last Pain:  Vitals:   01/27/24 1500  TempSrc: Temporal  PainSc: 2                  Fairy POUR Saudia Smyser

## 2024-01-27 NOTE — Transfer of Care (Signed)
 Immediate Anesthesia Transfer of Care Note  Patient: Laurie Watson  Procedure(s) Performed: ARTHROSCOPY, SHOULDER WITH DEBRIDEMENT (Right: Shoulder) REPAIR, TENDON, BICEPS, PROXIMAL (Right: Shoulder)  Patient Location: PACU  Anesthesia Type:General  Level of Consciousness: awake  Airway & Oxygen Therapy: Patient Spontanous Breathing  Post-op Assessment: Report given to RN and Post -op Vital signs reviewed and stable  Post vital signs: Reviewed and stable  Last Vitals:  Vitals Value Taken Time  BP 146/76 01/27/24 14:00  Temp    Pulse 71 01/27/24 14:03  Resp 15 01/27/24 14:03  SpO2 96 % 01/27/24 14:03  Vitals shown include unfiled device data.  Last Pain:  Vitals:   01/27/24 0916  TempSrc: Temporal  PainSc: 3          Complications: No notable events documented.

## 2024-01-27 NOTE — H&P (Signed)
 History of Present Illness:  Laurie Watson is a 73 y.o. female who presents today as a result of a referral from Medford Amber, PA-C, for right shoulder pain.   The patient's symptoms began several weeks ago and developed as the result of a fall while attending a wedding in [place] . Apparently she slipped on a wet surface and fell directly onto her right shoulder. She noted immediate pain and inability to raise her arm. She presented to a local urgent care facility where x-rays were obtained and reportedly were negative for fracture. The patient returned to the area and saw Medford Amber, [place], last week. He sent her for an MRI scan and referred her to me for further evaluation and treatment. The patient describes the symptoms as marked (major pain with significant limitations) and have the quality of being aching, miserable, nagging, stabbing, tender, and throbbing. The pain is localized to the lateral arm/shoulder. These symptoms are aggravated with normal daily activities, with sleeping, carrying heavy objects, at higher levels of activity, with overhead activity, reaching behind the back, getting dressed, and activity in general. She has tried acetaminophen  and non-steroidal anti-inflammatories (Advil) with temporary partial relief of her symptoms. She has tried rest , ice , and Biofreeze with temporary partial relief of her symptoms. She has not tried any physical therapy or steroid injections for the symptoms. The patient denies any prior issues with her right shoulder. She also denies any neck pain, nor does she note any numbness paresthesias down her arm to her hand. This complaint is not work related. She is a sports non-participant.  Shoulder Surgical History:  The patient has had no shoulder surgery in the past.  PMH/PSH/Family History/Social History/Meds/Allergies:  I have reviewed past medical, surgical, social and family history, medications and allergies as documented in the  EMR.  Current Outpatient Medications:  amLODIPine  (NORVASC ) 10 MG tablet Take 1 tablet by mouth once daily  aspirin 81 MG EC tablet Take 81 mg by mouth once daily.  cholecalciferol (CHOLECALCIFEROL) 1,000 unit tablet Take by mouth nightly.  docusate (COLACE) 50 MG capsule Take by mouth 2 (two) times daily.  gabapentin  (NEURONTIN ) 100 MG capsule Take 100 mg by mouth at bedtime  lactobacillus comb no.10 (PROBIOTIC) 20 billion cell Cap Take by mouth once daily.  losartan  (COZAAR ) 50 MG tablet Take 50 mg by mouth once daily  MICROLET LANCET Use once daily to check blood sugar  MOUNJARO  5 mg/0.5 mL PnIj Inject subcutaneously  omega-3 fatty acids/fish oil 340-1,000 mg capsule Take 1 capsule by mouth once daily.  omeprazole  (PRILOSEC) 20 MG DR capsule Take 20 mg by mouth once daily.  ONETOUCH ULTRA BLUE TEST STRIP test strip TO CHECK BLOOD SUGAR ONCE DAILY 5  simvastatin  (ZOCOR ) 20 MG tablet Take 20 mg by mouth nightly.   Allergies:  Codeine Nausea  Penicillin Rash   Past Medical History:  Acid reflux 09/05/2014  Diabetes mellitus without complication (CMS/HHS-HCC) 03/05/2015  Hyperlipidemia   Past Surgical History:   EGD 05/05/2011 (No repeat per RTE)  COLONOSCOPY 05/05/2011 [facility] (Father): CBF 04/2016)  COLONOSCOPY 01/18/2018 (Adenomatous Polyp; [facility] (Father) CBF 12/2022)  Colon @ [facility] 06/29/2023 (Tubular adenoma/Repeat 32yrs/SMR)   Family History:  Colon cancer Father  Cancer Mother   Social History:   Socioeconomic History:  Marital status: Married  Tobacco Use  Smoking status: Never  Smokeless tobacco: Never  Substance and Sexual Activity  Drug use: No   Social Drivers of Health:   Physicist, Medical Strain: Low Risk (01/06/2024)  Overall Financial Resource Strain (CARDIA)  Difficulty of Paying Living Expenses: Not hard at all  Food Insecurity: No Food Insecurity (01/06/2024)  Hunger Vital Sign  Worried About Running Out of Food in the Last Year: Never true  Ran Out  of Food in the Last Year: Never true  Transportation Needs: No Transportation Needs (01/06/2024)  PRAPARE - Risk Analyst (Medical): No  Lack of Transportation (Non-Medical): No   Review of Systems:  A comprehensive 14 point ROS was performed, reviewed, and the pertinent orthopaedic findings are documented in the HPI.  Physical Exam:  There were no vitals filed for this visit. General/Constitutional: The patient appears to be well-nourished, well-developed, and in no acute distress. Neuro/Psych: Normal mood and affect, oriented to person, place and time. Eyes: Non-icteric. Pupils are equal, round, and reactive to light, and exhibit synchronous movement. ENT: Unremarkable. Lymphatic: No palpable adenopathy. Respiratory: Lungs clear to auscultation, Normal chest excursion, No wheezes, and Non-labored breathing Cardiovascular: Regular rate and rhythm. No murmurs. and No edema, swelling or tenderness, except as noted in detailed exam. Integumentary: No impressive skin lesions present, except as noted in detailed exam. Musculoskeletal: Unremarkable, except as noted in detailed exam.  Right shoulder exam: SKIN: normal SWELLING: none WARMTH: none LYMPH NODES: no adenopathy palpable CREPITUS: none TENDERNESS: Mildly tender over anterolateral shoulder ROM (active):  Forward flexion: 90 degrees Abduction: 75 degrees Internal rotation: L2 ROM (passive):  Forward flexion: 110 degrees Abduction: 90 degrees  ER/IR at 90 abd: 35 degrees / 20 degrees  She experiences moderate to severe pain with all motions.  STRENGTH: Forward flexion: 3/5 Abduction: 3/5 External rotation: 4/5 Internal rotation: 4+/5 Pain with RC testing: Moderate pain with resisted forward flexion abduction more so than with resisted external rotation  STABILITY: Normal  SPECIAL TESTS: Vonzell' test: positive, moderate Speed's test: Equivocally positive Capsulitis - pain w/ passive ER:  no Crossed arm test: Moderately positive Crank: Not evaluated Anterior apprehension: Negative Posterior apprehension: Not evaluated  She is neurovascularly intact to the right upper extremity.  Imaging:  Right Shoulder MRI: MRI Shoulder Cartilage: No cartilage abnormality. MRI Shoulder Rotator Cuff: Full thickness tear of the supraspinatus. Retracted to the humeral head. MRI Shoulder Labrum / Biceps: No labral tear or biceps abormality. MRI Shoulder Bone: Normal bone. Moderate degenerative changes of the AC joint.  Both the films and report were reviewed by myself and discussed with the patient and her husband.  Assessment:  1. Traumatic complete tear of right rotator cuff.  2. Rotator cuff tendinitis, right.   Plan:  The treatment options were discussed with the patient and her husband. In addition, patient educational materials were provided regarding the diagnosis and treatment options. The patient is quite frustrated by her symptoms and function limitations, and is ready to consider more aggressive treatment options. Therefore, I have recommended a surgical procedure, specifically a right shoulder arthroscopy with debridement, decompression, rotator cuff repair, and probable biceps tenodesis. The procedure was discussed with the patient, as were the potential risks (including bleeding, infection, nerve and/or blood vessel injury, persistent or recurrent pain, failure of the repair, progression of arthritis, need for further surgery, blood clots, strokes, heart attacks and/or arhythmias, pneumonia, etc.) and benefits. The patient states her understanding and wishes to proceed. All of the patient's questions and concerns were answered. She can call any time with further concerns. She will follow up post-surgery, routine.    H&P reviewed and patient re-examined. No changes.

## 2024-01-27 NOTE — Anesthesia Procedure Notes (Signed)
 Anesthesia Regional Block: Interscalene brachial plexus block   Pre-Anesthetic Checklist: , timeout performed,  Correct Patient, Correct Site, Correct Laterality,  Correct Procedure, Correct Position, site marked,  Risks and benefits discussed,  Surgical consent,  Pre-op evaluation,  At surgeon's request and post-op pain management  Laterality: Upper and Right  Prep: chloraprep       Needles:  Injection technique: Single-shot  Needle Type: Stimiplex     Needle Length: 9cm  Needle Gauge: 22     Additional Needles:   Procedures:,,,, ultrasound used (permanent image in chart),,    Narrative:  Start time: 01/27/2024 9:57 AM End time: 01/27/2024 9:59 AM Injection made incrementally with aspirations every 5 mL.  Performed by: Personally   Additional Notes: Patient consented for risk and benefits of nerve block including but not limited to nerve damage, Horner's syndrome, failed block, bleeding and infection.  Patient voiced understanding.  Functioning IV was confirmed and monitors were applied.  Timeout done prior to procedure and prior to any sedation being given to the patient.  Patient confirmed procedure site prior to any sedation given to the patient.  A 50mm 22ga Stimuplex needle was used. Sterile prep,hand hygiene and sterile gloves were used.  Minimal sedation used for procedure.  No paresthesia endorsed by patient during the procedure.  Negative aspiration and negative test dose prior to incremental administration of local anesthetic. The patient tolerated the procedure well with no immediate complications.

## 2024-01-28 ENCOUNTER — Encounter: Payer: Self-pay | Admitting: Surgery

## 2024-01-29 ENCOUNTER — Ambulatory Visit: Payer: Self-pay | Admitting: Family Medicine

## 2024-02-03 DIAGNOSIS — M25511 Pain in right shoulder: Secondary | ICD-10-CM | POA: Diagnosis not present

## 2024-02-10 DIAGNOSIS — M25511 Pain in right shoulder: Secondary | ICD-10-CM | POA: Diagnosis not present

## 2024-02-14 ENCOUNTER — Other Ambulatory Visit: Payer: Self-pay | Admitting: Family Medicine

## 2024-02-14 DIAGNOSIS — I10 Essential (primary) hypertension: Secondary | ICD-10-CM

## 2024-02-14 DIAGNOSIS — K219 Gastro-esophageal reflux disease without esophagitis: Secondary | ICD-10-CM

## 2024-02-16 DIAGNOSIS — M25511 Pain in right shoulder: Secondary | ICD-10-CM | POA: Diagnosis not present

## 2024-02-23 DIAGNOSIS — M25511 Pain in right shoulder: Secondary | ICD-10-CM | POA: Diagnosis not present

## 2024-03-05 ENCOUNTER — Other Ambulatory Visit: Payer: Self-pay | Admitting: Family Medicine

## 2024-03-05 DIAGNOSIS — B0223 Postherpetic polyneuropathy: Secondary | ICD-10-CM

## 2024-03-29 ENCOUNTER — Encounter: Payer: Self-pay | Admitting: Surgery

## 2024-12-26 ENCOUNTER — Ambulatory Visit: Admitting: Family Medicine

## 2025-01-04 ENCOUNTER — Ambulatory Visit: Admitting: Family Medicine

## 2025-01-24 ENCOUNTER — Ambulatory Visit
